# Patient Record
Sex: Male | Born: 1945 | Race: White | Hispanic: No | Marital: Married | Smoking: Never smoker
Health system: Southern US, Community
[De-identification: ages and names within clinical notes are randomized; demographics above are authoritative.]

## PROBLEM LIST (undated history)

## (undated) DIAGNOSIS — M109 Gout, unspecified: Secondary | ICD-10-CM

## (undated) DIAGNOSIS — N401 Enlarged prostate with lower urinary tract symptoms: Secondary | ICD-10-CM

## (undated) DIAGNOSIS — N200 Calculus of kidney: Secondary | ICD-10-CM

## (undated) DIAGNOSIS — E785 Hyperlipidemia, unspecified: Secondary | ICD-10-CM

## (undated) DIAGNOSIS — Z973 Presence of spectacles and contact lenses: Secondary | ICD-10-CM

## (undated) DIAGNOSIS — I10 Essential (primary) hypertension: Secondary | ICD-10-CM

## (undated) DIAGNOSIS — Z87442 Personal history of urinary calculi: Secondary | ICD-10-CM

## (undated) DIAGNOSIS — G4733 Obstructive sleep apnea (adult) (pediatric): Secondary | ICD-10-CM

## (undated) DIAGNOSIS — C61 Malignant neoplasm of prostate: Secondary | ICD-10-CM

## (undated) HISTORY — DX: Obstructive sleep apnea (adult) (pediatric): G47.33

## (undated) HISTORY — DX: Essential (primary) hypertension: I10

## (undated) HISTORY — DX: Gout, unspecified: M10.9

## (undated) HISTORY — DX: Calculus of kidney: N20.0

## (undated) HISTORY — PX: FOOT SURGERY: SHX648

## (undated) HISTORY — DX: Hyperlipidemia, unspecified: E78.5

---

## 1999-08-20 ENCOUNTER — Encounter: Admission: RE | Admit: 1999-08-20 | Discharge: 1999-08-20 | Payer: Self-pay | Admitting: Endocrinology

## 1999-08-20 ENCOUNTER — Encounter: Payer: Self-pay | Admitting: Endocrinology

## 2001-12-20 ENCOUNTER — Ambulatory Visit (HOSPITAL_COMMUNITY): Admission: RE | Admit: 2001-12-20 | Discharge: 2001-12-20 | Payer: Self-pay | Admitting: Gastroenterology

## 2004-05-23 ENCOUNTER — Ambulatory Visit (HOSPITAL_COMMUNITY): Admission: RE | Admit: 2004-05-23 | Discharge: 2004-05-23 | Payer: Self-pay | Admitting: Orthopedic Surgery

## 2007-09-23 HISTORY — PX: SHOULDER ARTHROSCOPY: SHX128

## 2008-02-15 ENCOUNTER — Emergency Department (HOSPITAL_COMMUNITY): Admission: EM | Admit: 2008-02-15 | Discharge: 2008-02-15 | Payer: Self-pay | Admitting: Emergency Medicine

## 2010-10-09 ENCOUNTER — Encounter
Admission: RE | Admit: 2010-10-09 | Discharge: 2010-10-09 | Payer: Self-pay | Source: Home / Self Care | Attending: Internal Medicine | Admitting: Internal Medicine

## 2013-08-05 ENCOUNTER — Encounter: Payer: Self-pay | Admitting: Internal Medicine

## 2013-08-08 ENCOUNTER — Encounter: Payer: Self-pay | Admitting: Internal Medicine

## 2013-08-08 ENCOUNTER — Ambulatory Visit (INDEPENDENT_AMBULATORY_CARE_PROVIDER_SITE_OTHER): Payer: Medicare Other | Admitting: Internal Medicine

## 2013-08-08 VITALS — BP 130/78 | HR 63 | Ht 70.0 in | Wt 227.4 lb

## 2013-08-08 DIAGNOSIS — R05 Cough: Secondary | ICD-10-CM

## 2013-08-08 MED ORDER — GABAPENTIN 300 MG PO CAPS: ORAL_CAPSULE | ORAL | Status: DC

## 2013-08-08 MED ORDER — FLUTICASONE PROPIONATE 50 MCG/ACT NA SUSP: 2.0000 | Freq: Every day | NASAL | Status: DC

## 2013-08-08 NOTE — Patient Instructions (Signed)
Cough is post viral reactive cough and now persistent due to sinus drainage, acid reflux,  and voice stress All of this is working together to cause cyclical cough/LPR cough/irritable larynx syndrome  #Sinus drainage  -  start nasal steroid generic fluticasone inhaler 2 squirts each nostril daily as advised (nurse will do script) - Start 2.7% hypertonic saline nasal spray made by a company called [employer], 2 squirts in each nostril at night  #Possible Acid Reflux  - take otc zegerid 20mg  1 capsule daily on empty stomach (nurse will send script)   -  avoid colas, spices, cheeses, spirits, red meats, beer, chocolates, fried foods etc.,   - sleep with head end of bed elevated  - eat small frequent meals  - do not go to bed for 3 hours after last meal   #Cyclical cough or Voice STress  - please choose 2-3 days and observe complete voice rest - no talking or whispering  - at all times there  there is urge to cough, drink water or swallow or sip on throat lozenge - Please visit with Mr. Carl Schinke neuro rehabilitation speech therapist - Take gabapentin 300mg once daily x 3 days, then 300mg twice daily x 3 days, then 300mg three times daily to continue. If this makes you too sleepy or drowsy call us and we will cut your medication dosing down   #Followup - Myself or NP Tammy I will see you in 4weeks. - consider empiric inhaler therapy or methacholine challenge test for asthma if unimproved or only partially improved at followup  - any problems call or come sooner 

## 2013-08-08 NOTE — Assessment & Plan Note (Signed)
Cough is post viral reactive cough and now persistent due to sinus drainage, acid reflux,  and voice stress All of this is working together to cause cyclical cough/LPR cough/irritable larynx syndrome  #Sinus drainage  -  start nasal steroid generic fluticasone inhaler 2 squirts each nostril daily as advised (nurse will do script) - Start 2.7% hypertonic saline nasal spray made by a company called [employer], 2 squirts in each nostril at night  #Possible Acid Reflux  - take otc zegerid 20mg   1 capsule daily on empty stomach (nurse will send script)   -  avoid colas, spices, cheeses, spirits, red meats, beer, chocolates, fried foods etc.,   - sleep with head end of bed elevated  - eat small frequent meals  - do not go to bed for 3 hours after last meal   #Cyclical cough or Voice STress  - please choose 2-3 days and observe complete voice rest - no talking or whispering  - at all times there  there is urge to cough, drink water or swallow or sip on throat lozenge - Please visit with Mr. Verdie Mosher neuro rehabilitation speech therapist - Take gabapentin 300mg  once daily x 3 days, then 300mg  twice daily x 3 days, then 300mg  three times daily to continue. If this makes you too sleepy or drowsy call us and we will cut your medication dosing down   #Followup - Myself or NP Tammy I will see you in 4weeks. - consider empiric inhaler therapy or methacholine challenge test for asthma if unimproved or only partially improved at followup  - any problems call or come sooner

## 2013-08-08 NOTE — Progress Notes (Signed)
Subjective:    Patient ID: Gregory Reid, male    DOB: Jun 04, 1946, 67 y.o.   MRN: [id number] PCP Pearson Grippe, MD  HPI  IOV 08/08/2013  67 year old male referred by Dr. Pearson Grippe for chronic cough. At baseline he periodically gets respiratory infection that followed by a cough that usually resolves in a few weeks although in the last few years he's noticed that it takes longer for him to resolve his cough. Then in April 2014 he had upper respiratory infection which resolved but the cough never really resolved and is persisted through now for the last 6 months or so. Cough is essentially dry in quality but when he coughs hard he might get out some white mucus. He feels that his cough is emanating from the voice box in the throat area. There is associated ticklish sensation in the throat with a gag and feeling of mucus in the back of the throat. He does clear his throat often. Cough quality is reporting. Coughs day and night. Overall severity is mild and he feels this is social nuisance. 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 Cough is relieved by cold water which he drinks periodically. Waxing and waning course without complete resolution of the cough  COugh relevant hx  Sinus  - Denies active sinus drainage except occasionally in the spring  GI/GERD  - Denies active GERD but eating chocolates does make his cough worse  Pulmonary  - CXR 04/12/13 per report from [facility] docs Pearson Grippe, MD - no active diseaes   Expousre   reports that he has never smoked. He does not have any smokeless tobacco history on file.  BP  - not on ace inhibitor    Dr Gretta Cool Reflux Symptom Index (> 13-15 suggestive of LPR cough) 0 -> 5  =  none ->severe problem 08/08/2013   Hoarseness of problem with voice 1  Clearing  Of Throat 1  Excess throat mucus or feeling of post nasal drip 2  Difficulty swallowing food, liquid or tablets 0  Cough  after eating or lying down 2  Breathing difficulties or choking episodes 0  Troublesome or annoying cough 3  Sensation of something sticking in throat or lump in throat 3  Heartburn, chest pain, indigestion, or stomach acid coming up 1  TOTAL 13     Kouffman Reflux v Neurogenic Cough Differentiator Reflux  Do you awaken from a sound sleep coughing violently?                            With trouble breathing? no  Do you have choking episodes when you cannot  Get enough air, gasping for air ?              no  Do you usually cough when you lie down into  The bed, or when you just lie down to rest ?                          no  Do you usually cough after meals or eating?         no  Do you cough when (or after) you bend over?    no  GERD SCORE  0  Kouffman Reflux v Neurogenic Cough Differentiator Neurogeni  Do you more-or-less cough all day long? yes  Does change of  temperature make you cough? n  Does laughing or chuckling cause you to cough? n  Do fumes (perfume, automobile fumes, burned  Toast, etc.,) cause you to cough ?      n  Does speaking, singing, or talking on the phone cause you to cough   ?               ywa  Neurogenic/Airway score 2     Past Medical History  Diagnosis Date  . Hypertension   . Hyperlipidemia   . Gout   . Nephrolithiasis      Family History  Problem Relation Age of Onset  . Lung cancer Father      History   Social History  . Marital Status: Married    Spouse Name: N/A    Number of Children: N/A  . Years of Education: N/A   Occupational History  . Not on file.   Social History Main Topics  . Smoking status: Never Smoker   . Smokeless tobacco: Not on file  . Alcohol Use: No  . Drug Use: No  . Sexual Activity: Not on file   Other Topics Concern  . Not on file   Social History Narrative  . No narrative on file     No Known Allergies   Outpatient Prescriptions Prior to Visit  Medication Sig Dispense Refill  . allopurinol  (ZYLOPRIM) 300 MG tablet Take 300 mg by mouth daily.      Marland Kitchen amLODipine (NORVASC) 5 MG tablet Take 5 mg by mouth daily.      Marland Kitchen aspirin 81 MG tablet Take 81 mg by mouth daily.      Marland Kitchen atorvastatin (LIPITOR) 20 MG tablet Take 20 mg by mouth daily.      . cholecalciferol (VITAMIN D) 1000 UNITS tablet Take 1,000 Units by mouth daily.      . folic acid (FOLVITE) 800 MCG tablet Take 400 mcg by mouth daily.      . nebivolol (BYSTOLIC) 5 MG tablet Take 5 mg by mouth daily.      . vitamin B-12 (CYANOCOBALAMIN) 1000 MCG tablet Take 2,000 mcg by mouth daily.      Marland Kitchen loratadine (CLARITIN) 10 MG tablet Take 10 mg by mouth daily.       No facility-administered medications prior to visit.   4   Review of Systems  Constitutional: Negative for fever and unexpected weight change.  HENT: Positive for congestion and postnasal drip. Negative for dental problem, ear pain, nosebleeds, rhinorrhea, sinus pressure, sneezing, sore throat and trouble swallowing.   Eyes: Negative for redness and itching.  Respiratory: Positive for cough. Negative for chest tightness, shortness of breath and wheezing.   Cardiovascular: Negative for palpitations and leg swelling.  Gastrointestinal: Negative for nausea and vomiting.  Genitourinary: Negative for dysuria.  Musculoskeletal: Negative for joint swelling.  Skin: Negative for rash.  Neurological: Negative for headaches.  Hematological: Does not bruise/bleed easily.  Psychiatric/Behavioral: Negative for dysphoric mood. The patient is not nervous/anxious.        Objective:   Physical Exam  Nursing note and vitals reviewed. Constitutional: He is oriented to person, place, and time. He appears well-developed and well-nourished. No distress.  HENT:  Head: Normocephalic and atraumatic.  Right Ear: External ear normal.  Left Ear: External ear normal.  Mouth/Throat: Oropharynx is clear and moist. No oropharyngeal exudate.  Postnasal drip present Periodically coughs with a  laryngeal quality to the cough  Eyes: Conjunctivae and EOM are normal. Pupils  are equal, round, and reactive to light. Right eye exhibits no discharge. Left eye exhibits no discharge. No scleral icterus.  Neck: Normal range of motion. Neck supple. No JVD present. No tracheal deviation present. No thyromegaly present.  Cardiovascular: Normal rate, regular rhythm and intact distal pulses.  Exam reveals no gallop and no friction rub.   No murmur heard. Pulmonary/Chest: Effort normal and breath sounds normal. No respiratory distress. He has no wheezes. He has no rales. He exhibits no tenderness.  Abdominal: Soft. Bowel sounds are normal. He exhibits no distension and no mass. There is no tenderness. There is no rebound and no guarding.  Musculoskeletal: Normal range of motion. He exhibits no edema and no tenderness.  Lymphadenopathy:    He has no cervical adenopathy.  Neurological: He is alert and oriented to person, place, and time. He has normal reflexes. No cranial nerve deficit. Coordination normal.  Skin: Skin is warm and dry. No rash noted. He is not diaphoretic. No erythema. No pallor.  Psychiatric: He has a normal mood and affect. His behavior is normal. Judgment and thought content normal.          Assessment & Plan:

## 2013-08-09 ENCOUNTER — Telehealth: Payer: Self-pay | Admitting: Internal Medicine

## 2013-08-09 NOTE — Telephone Encounter (Signed)
Odd,  I have picked this up OTC myself from [facility]  Here are the details    NasaMist Extra Strength Hypertonic (made by [employer]) [employer]ist Extra Strength Hypertonic Saline Spray is a sterile natural saline spray.  [email or web address]  Honestly, if he cannot find it just have the pharmacis get him any hypertonic nasal saline spray and if they still do not have it, he can use netti pot nightly; again available OTC   Dr. Kalman Shan, M.D., [facility].C.P Pulmonary and Critical Care Medicine Staff Physician [facility] Pager: [phone], If no answer or between  15:00h - 7:00h: call [phone]  08/09/2013 3:59 PM

## 2013-08-09 NOTE — Telephone Encounter (Signed)
lmomtcb x1 for pt-- where has he looked? Did he ask the pharmacists? This is by [employer]

## 2013-08-09 NOTE — Telephone Encounter (Signed)
Pt advised to check [facility]. Carron Curie, CMA

## 2013-08-09 NOTE — Telephone Encounter (Signed)
I spoke with pt. He checked with [facility] and they did not have this. They went online and looked to see where this could be found. He was told this has to be compounded and only possibly 2 pharmacies could do this but could not guarantee. Pt is going to get saline spray OTC. Will foward to MR as an [place]

## 2013-08-10 NOTE — Telephone Encounter (Signed)
Spoke to pt he is aware of his appt 08/11/13 Gregory Reid

## 2013-08-12 ENCOUNTER — Ambulatory Visit: Payer: Medicare Other | Attending: Internal Medicine

## 2013-08-12 DIAGNOSIS — IMO0001 Reserved for inherently not codable concepts without codable children: Secondary | ICD-10-CM | POA: Insufficient documentation

## 2013-08-12 DIAGNOSIS — R498 Other voice and resonance disorders: Secondary | ICD-10-CM | POA: Insufficient documentation

## 2013-08-15 ENCOUNTER — Ambulatory Visit: Payer: Medicare Other

## 2013-08-16 ENCOUNTER — Ambulatory Visit: Payer: Medicare Other

## 2013-08-23 ENCOUNTER — Ambulatory Visit: Payer: Medicare Other | Attending: Internal Medicine | Admitting: Speech Pathology

## 2013-08-23 DIAGNOSIS — IMO0001 Reserved for inherently not codable concepts without codable children: Secondary | ICD-10-CM | POA: Insufficient documentation

## 2013-08-23 DIAGNOSIS — R498 Other voice and resonance disorders: Secondary | ICD-10-CM | POA: Insufficient documentation

## 2013-08-26 ENCOUNTER — Ambulatory Visit: Payer: Medicare Other

## 2013-08-30 ENCOUNTER — Ambulatory Visit: Payer: Medicare Other

## 2013-09-02 ENCOUNTER — Ambulatory Visit: Payer: Medicare Other

## 2013-09-05 ENCOUNTER — Encounter: Payer: Self-pay | Admitting: Adult Health

## 2013-09-05 ENCOUNTER — Ambulatory Visit (INDEPENDENT_AMBULATORY_CARE_PROVIDER_SITE_OTHER): Payer: Medicare Other | Admitting: Adult Health

## 2013-09-05 VITALS — BP 116/64 | HR 62 | Temp 97.9°F | Ht 70.0 in | Wt 230.8 lb

## 2013-09-05 DIAGNOSIS — R05 Cough: Secondary | ICD-10-CM

## 2013-09-05 NOTE — Progress Notes (Signed)
Subjective:    Patient ID: Gregory Reid, male    DOB: [birth year], 67 y.o.   MRN: [id number] PCP Pearson Grippe, MD  HPI  IOV 08/08/2013  67 year old male referred by Dr. Pearson Grippe for chronic cough. At baseline he periodically gets respiratory infection that followed by a cough that usually resolves in a few weeks although in the last few years he's noticed that it takes longer for him to resolve his cough. Then in April 2014 he had upper respiratory infection which resolved but the cough never really resolved and is persisted through now for the last 6 months or so. Cough is essentially dry in quality but when he coughs hard he might get out some white mucus. He feels that his cough is emanating from the voice box in the throat area. There is associated ticklish sensation in the throat with a gag and feeling of mucus in the back of the throat. He does clear his throat often. Cough quality is reporting. Coughs day and night. Overall severity is mild and he feels this is social nuisance. 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 Cough is relieved by cold water which he drinks periodically. Waxing and waning course without complete resolution of the cough  COugh relevant hx  Sinus  - Denies active sinus drainage except occasionally in the spring  GI/GERD  - Denies active GERD but eating chocolates does make his cough worse  Pulmonary  - CXR 04/12/13 per report from [facility] docs Pearson Grippe, MD - no active diseaes   Expousre   reports that he has never smoked. He does not have any smokeless tobacco history on file.  BP  - not on ace inhibitor     Dr Gretta Cool Reflux Symptom Index (> 13-15 suggestive of LPR cough) 0 -> 5  =  none ->severe problem 08/08/2013   Hoarseness of problem with voice 1  Clearing  Of Throat 1  Excess throat mucus or feeling of post nasal drip 2  Difficulty swallowing food, liquid or tablets 0  Cough  after eating or lying down 2  Breathing difficulties or choking episodes 0  Troublesome or annoying cough 3  Sensation of something sticking in throat or lump in throat 3  Heartburn, chest pain, indigestion, or stomach acid coming up 1  TOTAL 13     Kouffman Reflux v Neurogenic Cough Differentiator Reflux  Do you awaken from a sound sleep coughing violently?                            With trouble breathing? no  Do you have choking episodes when you cannot  Get enough air, gasping for air ?              no  Do you usually cough when you lie down into  The bed, or when you just lie down to rest ?                          no  Do you usually cough after meals or eating?         no  Do you cough when (or after) you bend over?    no  GERD SCORE  0  Kouffman Reflux v Neurogenic Cough Differentiator Neurogeni  Do you more-or-less cough all day long? yes  Does change  of temperature make you cough? n  Does laughing or chuckling cause you to cough? n  Do fumes (perfume, automobile fumes, burned  Toast, etc.,) cause you to cough ?      n  Does speaking, singing, or talking on the phone cause you to cough   ?               ywa  Neurogenic/Airway score 2    09/05/2013 Follow up cough  4 week follow up - reports cough is improved since last ov, but did develop a cold 2 weeks ago that may have hindered progress.  would like to discuss whether or not follow up is needed w/ Dr Dellia Cloud therapy.  Patient seen last visit for initial consult for her chronic cough. He was started on aggressive reflux, and rhinitis prevention. He was also started on Neurontin. Patient reports his cough is substantially improved. He did go to speech therapy for review. Visit. Prefer not to go back. Patient denies any hemoptysis, discolored mucus, fever, or overt reflux, sinus congestion. Patient says he did have a colposcopy 1-2 weeks ago, but seems to be over the most of the symptoms. He rates his cough has been 75%  improved.    Review of Systems  Constitutional: Negative for fever and unexpected weight change.  HENT:  Negative for dental problem, ear pain, nosebleeds, rhinorrhea, sinus pressure, sneezing, sore throat and trouble swallowing.   Eyes: Negative for redness and itching.  Respiratory: Positive for cough. Negative for chest tightness, shortness of breath and wheezing.   Cardiovascular: Negative for palpitations and leg swelling.  Gastrointestinal: Negative for nausea and vomiting.  Genitourinary: Negative for dysuria.  Musculoskeletal: Negative for joint swelling.  Skin: Negative for rash.  Neurological: Negative for headaches.  Hematological: Does not bruise/bleed easily.  Psychiatric/Behavioral: Negative for dysphoric mood. The patient is not nervous/anxious.        Objective:   Physical Exam  GEN: A/Ox3; pleasant , NAD, well nourished   HEENT:  [place]/AT,  EACs-clear, TMs-wnl, NOSE-clear, THROAT-clear, no lesions, no postnasal drip or exudate noted.   NECK:  Supple w/ fair ROM; no JVD; normal carotid impulses w/o bruits; no thyromegaly or nodules palpated; no lymphadenopathy.  RESP  Clear  P & A; w/o, wheezes/ rales/ or rhonchi.no accessory muscle use, no dullness to percussion  CARD:  RRR, no m/r/g  , no peripheral edema, pulses intact, no cyanosis or clubbing.  GI:   Soft & nt; nml bowel sounds; no organomegaly or masses detected.  Musco: Warm bil, no deformities or joint swelling noted.   Neuro: alert, no focal deficits noted.    Skin: Warm, no lesions or rashes   Assessment & Plan:

## 2013-09-05 NOTE — Patient Instructions (Addendum)
Continue on Saline nasal rinses As needed   Continue on  fluticasone inhaler 2 squirts each nostril daily -  Continue on Zegerid 20mg   1 capsule daily on empty stomach    -  avoid colas, spices, cheeses, spirits, red meats, beer, MINTS, chocolates, fried foods etc.,   - sleep with head end of bed elevated  - eat small frequent meals  - do not go to bed for 3 hours after last meal Continue voice rest and avoid voice strain with singing/prolonged talking /laughing. At all times there  there is urge to cough, drink water or swallow or sip on throat lozenge Decrease Neurontin 300mg  Twice daily  For 2 weeks then 300mg  At bedtime  For 2 weeks then stop.  follow up Dr. Marchelle Gearing in 6 weeks and As needed   May use Delsym 2 tsp Twice daily  As needed  Cough .

## 2013-09-06 ENCOUNTER — Ambulatory Visit: Payer: Medicare Other

## 2013-09-06 NOTE — Assessment & Plan Note (Signed)
Slowly improving  Cont w/ trigger prevention with GERD/AR along with cough suppression  Will try to slowly wean neurontin as he is improving    Plan  Continue on Saline nasal rinses As needed   Continue on  fluticasone inhaler 2 squirts each nostril daily -  Continue on Zegerid 20mg   1 capsule daily on empty stomach    -  avoid colas, spices, cheeses, spirits, red meats, beer, MINTS, chocolates, fried foods etc.,   - sleep with head end of bed elevated  - eat small frequent meals  - do not go to bed for 3 hours after last meal Continue voice rest and avoid voice strain with singing/prolonged talking /laughing. At all times there  there is urge to cough, drink water or swallow or sip on throat lozenge Decrease Neurontin 300mg  Twice daily  For 2 weeks then 300mg  At bedtime  For 2 weeks then stop.  follow up Dr. Marchelle Gearing in 6 weeks and As needed   May use Delsym 2 tsp Twice daily  As needed  Cough .

## 2013-09-13 ENCOUNTER — Ambulatory Visit: Payer: Medicare Other | Admitting: Speech Pathology

## 2013-09-20 ENCOUNTER — Encounter: Payer: Medicare Other | Admitting: Speech Pathology

## 2013-11-03 ENCOUNTER — Encounter (INDEPENDENT_AMBULATORY_CARE_PROVIDER_SITE_OTHER): Payer: Self-pay

## 2013-11-03 ENCOUNTER — Encounter: Payer: Self-pay | Admitting: Internal Medicine

## 2013-11-03 ENCOUNTER — Ambulatory Visit (INDEPENDENT_AMBULATORY_CARE_PROVIDER_SITE_OTHER): Payer: Medicare Other | Admitting: Internal Medicine

## 2013-11-03 VITALS — BP 140/90 | HR 82 | Ht 70.0 in | Wt 225.2 lb

## 2013-11-03 DIAGNOSIS — J329 Chronic sinusitis, unspecified: Secondary | ICD-10-CM

## 2013-11-03 DIAGNOSIS — R05 Cough: Secondary | ICD-10-CM

## 2013-11-03 DIAGNOSIS — R053 Chronic cough: Secondary | ICD-10-CM

## 2013-11-03 DIAGNOSIS — R059 Cough, unspecified: Secondary | ICD-10-CM

## 2013-11-03 NOTE — Progress Notes (Signed)
Subjective:    Patient ID: Gregory Reid, male    DOB: [birth year], 68 y.o.   MRN: [id number]  HPI   IOV 08/08/2013  68 year old male referred by Dr. Jani Gravel for chronic cough. At baseline he periodically gets respiratory infection that followed by a cough that usually resolves in a few weeks although in the last few years he's noticed that it takes longer for him to resolve his cough. Then in April 2014 he had upper respiratory infection which resolved but the cough never really resolved and is persisted through now for the last 6 months or so. Cough is essentially dry in quality but when he coughs hard he might get out some white mucus. He feels that his cough is emanating from the voice box in the throat area. There is associated ticklish sensation in the throat with a gag and feeling of mucus in the back of the throat. He does clear his throat often. Cough quality is reporting. Coughs day and night. Overall severity is mild and he feels this is social nuisance. 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 Cough is relieved by cold water which he drinks periodically. Waxing and waning course without complete resolution of the cough  COugh relevant hx  Sinus  - Denies active sinus drainage except occasionally in the spring  GI/GERD  - Denies active GERD but eating chocolates does make his cough worse  Pulmonary  - CXR 04/12/13 per report from [place], [place] - no active diseaes   Expousre   reports that he has never smoked. He does not have any smokeless tobacco history on file.  BP  - not on ace inhibitor   09/05/2013 Follow up cough  4 week follow up - reports cough is improved since last ov, but did develop a cold 2 weeks ago that may have hindered progress.  would like to discuss whether or not follow up is needed w/ Dr Hale Drone therapy.  Patient seen last visit for initial consult for her chronic  cough. He was started on aggressive reflux, and rhinitis prevention. He was also started on Neurontin. Patient reports his cough is substantially improved. He did go to speech therapy for review. Visit. Prefer not to go back. Patient denies any hemoptysis, discolored mucus, fever, or overt reflux, sinus congestion. Patient says he did have a colposcopy 1-2 weeks ago, but seems to be over the most of the symptoms. He rates his cough has been 75% improved.   REC Sius: saline and nasal stgeroid GERD: zergerid and diet LPR: voice rfest, decreasd and stop neurontin, use delsum   OV 11/03/2013   Followup chronic cough  He was doing really well up until end of December 2014. At that time he was on Neurontin but by early January 2015 he came off Neurontin as advised. Then by mid to end January 2015 cough is recurred and return to baseline severity. He did do some travel to [facility] and he was exposed to some pollen he feels that this might trigger the cough. Cough is associated with postnasal drainage that is significant. Nasal steroid and nasal saline spray to help with the drainage of the cough but due to some mild epistaxis he quit taking the nasal steroids. He is compliant with acid reflux treatment but does not feel this is a problem. He does not report any chest tightness or wheezing. He  is frustrated by the presence of cough and wants to get rid of it. He does admit to chronic postnasal drainage but no allergies and ticklish sensation of the throat. RSI cough score is 18 and is consistent with irritable larynx and is worse than before    Dr Lorenza Cambridge Reflux Symptom Index (> 13-15 suggestive of LPR cough)  08/08/2013  11/03/2013   Hoarseness of problem with voice 1 2  Clearing  Of Throat 1 2  Excess throat mucus or feeling of post nasal drip 2 3  Difficulty swallowing food, liquid or tablets 0 0  Cough after eating or lying down 2 3  Breathing difficulties or choking episodes  0 2  Troublesome or annoying cough 3 4  Sensation of something sticking in throat or lump in throat 3 2  Heartburn, chest pain, indigestion, or stomach acid coming up 1 0  TOTAL 13 18     Kouffman Reflux v Neurogenic Cough Differentiator Reflux  Do you awaken from a sound sleep coughing violently?                            With trouble breathing? no  Do you have choking episodes when you cannot  Get enough air, gasping for air ?              no  Do you usually cough when you lie down into  The bed, or when you just lie down to rest ?                          no  Do you usually cough after meals or eating?         no  Do you cough when (or after) you bend over?    no  GERD SCORE  0  Kouffman Reflux v Neurogenic Cough Differentiator Neurogeni  Do you more-or-less cough all day long? yes  Does change of temperature make you cough? n  Does laughing or chuckling cause you to cough? n  Do fumes (perfume, automobile fumes, burned  Toast, etc.,) cause you to cough ?      n  Does speaking, singing, or talking on the phone cause you to cough   ?               ywa  Neurogenic/Airway score 2     Review of Systems  Constitutional: Negative for fever and unexpected weight change.  HENT: Positive for postnasal drip. Negative for congestion, dental problem, ear pain, nosebleeds, rhinorrhea, sinus pressure, sneezing, sore throat and trouble swallowing.   Eyes: Negative for redness and itching.  Respiratory: Positive for cough. Negative for chest tightness, shortness of breath and wheezing.   Cardiovascular: Negative for palpitations and leg swelling.  Gastrointestinal: Negative for nausea and vomiting.  Genitourinary: Negative for dysuria.  Musculoskeletal: Negative for joint swelling.  Skin: Negative for rash.  Neurological: Negative for headaches.  Hematological: Does not bruise/bleed easily.  Psychiatric/Behavioral: Negative for dysphoric mood. The patient is not nervous/anxious.         Objective:   Physical Exam  Nursing note and vitals reviewed. Constitutional: He is oriented to person, place, and time. He appears well-developed and well-nourished. No distress.  HENT:  Head: Normocephalic and atraumatic.  Right Ear: External ear normal.  Left Ear: External ear normal.  Mouth/Throat: Oropharynx is clear and moist. No oropharyngeal exudate.  Post nasal drainage + Coughs  periodically +   Eyes: Conjunctivae and EOM are normal. Pupils are equal, round, and reactive to light. Right eye exhibits no discharge. Left eye exhibits no discharge. No scleral icterus.  Neck: Normal range of motion. Neck supple. No JVD present. No tracheal deviation present. No thyromegaly present.  Cardiovascular: Normal rate, regular rhythm and intact distal pulses.  Exam reveals no gallop and no friction rub.   No murmur heard. Pulmonary/Chest: Effort normal and breath sounds normal. No respiratory distress. He has no wheezes. He has no rales. He exhibits no tenderness.  Abdominal: Soft. Bowel sounds are normal. He exhibits no distension and no mass. There is no tenderness. There is no rebound and no guarding.  Musculoskeletal: Normal range of motion. He exhibits no edema and no tenderness.  Lymphadenopathy:    He has no cervical adenopathy.  Neurological: He is alert and oriented to person, place, and time. He has normal reflexes. No cranial nerve deficit. Coordination normal.  Skin: Skin is warm and dry. No rash noted. He is not diaphoretic. No erythema. No pallor.  Psychiatric: He has a normal mood and affect. His behavior is normal. Judgment and thought content normal.          Assessment & Plan:

## 2013-11-03 NOTE — Patient Instructions (Signed)
Please do sinus CT wo contrast and referENT Restart nasal steroid Continue saline nasal spray  Continue all acid reflux measurs  Do methacholine challenge test for asthma Do  HRCT chest for chronic couhg  High Resolution CT chest without contrast on ILD protocol. Only  Dr Lorin Picket or Dr. Vinnie Langton to read  Followup Return in 4-6 weeks but after completing all of abve Might have to cnsider neurontin at followup

## 2013-11-05 NOTE — Assessment & Plan Note (Addendum)
Chronic cough has recurred. ? Due to loss of neurontin effect + sinus issues +/- asthma. Need to rule out ILD as well  PLAN Please do sinus CT wo contrast and referENT Restart nasal steroid Continue saline nasal spray  Continue all acid reflux measurs  Do methacholine challenge test for asthma Do  HRCT chest for chronic couhg  High Resolution CT chest without contrast on ILD protocol. Only  Dr Lorin Picket or Dr. Vinnie Langton to read  Followup Return in 4-6 weeks but after completing all of abve Might have to cnsider neurontin at followup

## 2013-11-08 ENCOUNTER — Other Ambulatory Visit: Payer: Medicare Other

## 2013-11-08 ENCOUNTER — Encounter (HOSPITAL_COMMUNITY): Payer: Medicare Other

## 2013-11-08 ENCOUNTER — Inpatient Hospital Stay: Admission: RE | Admit: 2013-11-08 | Payer: Medicare Other | Source: Ambulatory Visit

## 2013-11-09 ENCOUNTER — Ambulatory Visit (HOSPITAL_COMMUNITY)
Admission: RE | Admit: 2013-11-09 | Discharge: 2013-11-09 | Disposition: A | Discharge status: Home / Self Care | Payer: Medicare Other | Source: Ambulatory Visit | Attending: Internal Medicine | Admitting: Internal Medicine

## 2013-11-09 ENCOUNTER — Ambulatory Visit (INDEPENDENT_AMBULATORY_CARE_PROVIDER_SITE_OTHER)
Admission: RE | Admit: 2013-11-09 | Discharge: 2013-11-09 | Disposition: A | Discharge status: Home / Self Care | Payer: Medicare Other | Source: Ambulatory Visit | Attending: Internal Medicine | Admitting: Internal Medicine

## 2013-11-09 DIAGNOSIS — R059 Cough, unspecified: Secondary | ICD-10-CM

## 2013-11-09 DIAGNOSIS — J329 Chronic sinusitis, unspecified: Secondary | ICD-10-CM

## 2013-11-09 DIAGNOSIS — R05 Cough: Secondary | ICD-10-CM

## 2013-11-09 DIAGNOSIS — R053 Chronic cough: Secondary | ICD-10-CM

## 2013-11-09 LAB — PULMONARY FUNCTION TEST
FEF 25-75 Post: 3.84 L/sec
FEF 25-75 Pre: 2.81 L/sec
FEF2575-%Change-Post: 36 %
FEF2575-%PRED-POST: 153 %
FEF2575-%PRED-PRE: 112 %
FEV1-%Change-Post: 6 %
FEV1-%PRED-PRE: 100 %
FEV1-%Pred-Post: 107 %
FEV1-PRE: 3.23 L
FEV1-Post: 3.45 L
FEV1FVC-%Change-Post: 7 %
FEV1FVC-%PRED-PRE: 106 %
FEV6-%Change-Post: 0 %
FEV6-%PRED-POST: 98 %
FEV6-%Pred-Pre: 98 %
FEV6-Post: 4.06 L
FEV6-Pre: 4.05 L
FEV6FVC-%Change-Post: 0 %
FEV6FVC-%Pred-Post: 105 %
FEV6FVC-%Pred-Pre: 104 %
FVC-%CHANGE-POST: 0 %
FVC-%PRED-POST: 93 %
FVC-%Pred-Pre: 94 %
FVC-POST: 4.08 L
FVC-Pre: 4.1 L
POST FEV1/FVC RATIO: 85 %
POST FEV6/FVC RATIO: 99 %
PRE FEV1/FVC RATIO: 79 %
Pre FEV6/FVC Ratio: 99 %

## 2013-11-09 MED ORDER — METHACHOLINE 16 MG/ML NEB SOLN
2.0000 mL | Freq: Once | RESPIRATORY_TRACT | Status: AC
  Administered 2013-11-09: 32 mg via RESPIRATORY_TRACT

## 2013-11-09 MED ORDER — SODIUM CHLORIDE 0.9 % IN NEBU
3.0000 mL | INHALATION_SOLUTION | Freq: Once | RESPIRATORY_TRACT | Status: AC
  Administered 2013-11-09: 3 mL via RESPIRATORY_TRACT

## 2013-11-09 MED ORDER — METHACHOLINE 0.25 MG/ML NEB SOLN
2.0000 mL | Freq: Once | RESPIRATORY_TRACT | Status: AC
  Administered 2013-11-09: 0.5 mg via RESPIRATORY_TRACT

## 2013-11-09 MED ORDER — METHACHOLINE 4 MG/ML NEB SOLN
2.0000 mL | Freq: Once | RESPIRATORY_TRACT | Status: AC
  Administered 2013-11-09: 8 mg via RESPIRATORY_TRACT

## 2013-11-09 MED ORDER — METHACHOLINE 1 MG/ML NEB SOLN
2.0000 mL | Freq: Once | RESPIRATORY_TRACT | Status: AC
  Administered 2013-11-09: 2 mg via RESPIRATORY_TRACT

## 2013-11-09 MED ORDER — ALBUTEROL SULFATE (2.5 MG/3ML) 0.083% IN NEBU
2.5000 mg | INHALATION_SOLUTION | Freq: Once | RESPIRATORY_TRACT | Status: AC
  Administered 2013-11-09: 2.5 mg via RESPIRATORY_TRACT

## 2013-11-09 MED ORDER — METHACHOLINE 0.0625 MG/ML NEB SOLN
2.0000 mL | Freq: Once | RESPIRATORY_TRACT | Status: AC
  Administered 2013-11-09: 0.125 mg via RESPIRATORY_TRACT

## 2013-11-16 ENCOUNTER — Other Ambulatory Visit: Payer: Self-pay | Admitting: Internal Medicine

## 2013-11-16 DIAGNOSIS — I2510 Atherosclerotic heart disease of native coronary artery without angina pectoris: Secondary | ICD-10-CM

## 2013-11-18 ENCOUNTER — Institutional Professional Consult (permissible substitution): Payer: Medicare Other | Admitting: Cardiology

## 2013-11-18 ENCOUNTER — Telehealth: Payer: Self-pay | Admitting: Internal Medicine

## 2013-11-18 NOTE — Telephone Encounter (Signed)
Just see DR Marlou Porch - cards definitely  Hold off allergy referral till he sees me at [facility] And then we can discuss  PPI - if nexium is expensive, can try OTC zegerid 20mg  daily on an empty stomach. OR we can try prescritpion protonix  Dr. Brand Males, M.D., [facility].C.P Pulmonary and Critical Care Medicine Staff Physician [place] [facility] Pager: [phone], If no answer or between  15:00h - 7:00h: call [phone]  11/18/2013 12:35 PM

## 2013-11-18 NOTE — Telephone Encounter (Signed)
Notes Recorded by Brand Males, MD on 11/11/2013 at 6:07 PM No ILD. Let him know there is coronary artery calcification: and if no stress test in a past few years, refer cards  Spoke with the pt and notified of these recs  He verbalized understanding  He is scheduled to see Dr. Marlou Porch in March  He states that Dr Janace Hoard rec PPI therapy and allergy referral  He feels uneasy about this- seeing another specialist and the nexium was going to cost over 200 $  He wants to know what MR thinks  Please advise thanks!

## 2013-11-18 NOTE — Telephone Encounter (Signed)
Spoke with the pt and notified of recs per MR  He verbalized understanding and nothing further needed 

## 2013-12-06 ENCOUNTER — Encounter: Payer: Self-pay | Admitting: Cardiology

## 2013-12-06 DIAGNOSIS — N200 Calculus of kidney: Secondary | ICD-10-CM | POA: Insufficient documentation

## 2013-12-06 DIAGNOSIS — E785 Hyperlipidemia, unspecified: Secondary | ICD-10-CM | POA: Insufficient documentation

## 2013-12-06 DIAGNOSIS — I10 Essential (primary) hypertension: Secondary | ICD-10-CM | POA: Insufficient documentation

## 2013-12-06 DIAGNOSIS — M109 Gout, unspecified: Secondary | ICD-10-CM | POA: Insufficient documentation

## 2013-12-08 ENCOUNTER — Encounter: Payer: Self-pay | Admitting: Internal Medicine

## 2013-12-08 ENCOUNTER — Ambulatory Visit (INDEPENDENT_AMBULATORY_CARE_PROVIDER_SITE_OTHER): Payer: Medicare Other | Admitting: Internal Medicine

## 2013-12-08 ENCOUNTER — Encounter (INDEPENDENT_AMBULATORY_CARE_PROVIDER_SITE_OTHER): Payer: Self-pay

## 2013-12-08 VITALS — BP 128/78 | HR 80 | Ht 70.0 in | Wt 223.0 lb

## 2013-12-08 DIAGNOSIS — I2510 Atherosclerotic heart disease of native coronary artery without angina pectoris: Secondary | ICD-10-CM

## 2013-12-08 DIAGNOSIS — K7689 Other specified diseases of liver: Secondary | ICD-10-CM

## 2013-12-08 DIAGNOSIS — K760 Fatty (change of) liver, not elsewhere classified: Secondary | ICD-10-CM

## 2013-12-08 DIAGNOSIS — R059 Cough, unspecified: Secondary | ICD-10-CM

## 2013-12-08 DIAGNOSIS — R05 Cough: Secondary | ICD-10-CM

## 2013-12-08 DIAGNOSIS — R053 Chronic cough: Secondary | ICD-10-CM

## 2013-12-08 MED ORDER — RANITIDINE HCL 150 MG PO TABS: 150.0000 mg | ORAL_TABLET | Freq: Two times a day (BID) | ORAL | Status: DC

## 2013-12-08 NOTE — Progress Notes (Signed)
Subjective:    Patient ID: Gregory Reid, male    DOB: 25-Mar-1946, 68 y.o.   MRN: [id number]  HPI   IOV 08/08/2013  68 year old male referred by Dr. Jani Gravel for chronic cough. At baseline he periodically gets respiratory infection that followed by a cough that usually resolves in a few weeks although in the last few years he's noticed that it takes longer for him to resolve his cough. Then in April 2014 he had upper respiratory infection which resolved but the cough never really resolved and is persisted through now for the last 6 months or so. Cough is essentially dry in quality but when he coughs hard he might get out some white mucus. He feels that his cough is emanating from the voice box in the throat area. There is associated ticklish sensation in the throat with a gag and feeling of mucus in the back of the throat. He does clear his throat often. Cough quality is reporting. Coughs day and night. Overall severity is mild and he feels this is social nuisance. 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 Cough is relieved by cold water which he drinks periodically. Waxing and waning course without complete resolution of the cough  COugh relevant hx  Sinus  - Denies active sinus drainage except occasionally in the spring  GI/GERD  - Denies active GERD but eating chocolates does make his cough worse  Pulmonary  - CXR 04/12/13 per report from [place], MD - no active diseaes   Expousre   reports that he has never smoked. He does not have any smokeless tobacco history on file.  BP  - not on ace inhibitor   09/05/2013 Follow up cough  4 week follow up - reports cough is improved since last ov, but did develop a cold 2 weeks ago that may have hindered progress.  would like to discuss whether or not follow up is needed w/ Dr Hale Drone therapy.  Patient seen last visit for initial consult for her chronic  cough. He was started on aggressive reflux, and rhinitis prevention. He was also started on Neurontin. Patient reports his cough is substantially improved. He did go to speech therapy for review. Visit. Prefer not to go back. Patient denies any hemoptysis, discolored mucus, fever, or overt reflux, sinus congestion. Patient says he did have a colposcopy 1-2 weeks ago, but seems to be over the most of the symptoms. He rates his cough has been 75% improved.   REC Sius: saline and nasal stgeroid GERD: zergerid and diet LPR: voice rfest, decreasd and stop neurontin, use delsum   OV 11/03/2013   Followup chronic cough  He was doing really well up until end of December 2014. At that time he was on Neurontin but by early January 2015 he came off Neurontin as advised. Then by mid to end January 2015 cough is recurred and return to baseline severity. He did do some travel to [place] and he was exposed to some pollen he feels that this might trigger the cough. Cough is associated with postnasal drainage that is significant. Nasal steroid and nasal saline spray to help with the drainage of the cough but due to some mild epistaxis he quit taking the nasal steroids. He is compliant with acid reflux treatment but does not feel this is a problem. He does not report any chest tightness or wheezing.  He is frustrated by the presence of cough and wants to get rid of it. He does admit to chronic postnasal drainage but no allergies and ticklish sensation of the throat. RSI cough score is 18 and is consistent with irritable larynx and is worse than before  REC Please do sinus CT wo contrast and referENT Restart nasal steroid Continue saline nasal spray  Continue all acid reflux measurs  Do methacholine challenge test for asthma Do  HRCT chest for chronic couhg  High Resolution CT chest without contrast on ILD protocol. Only  Dr Lorin Picket or Dr. Vinnie Langton to read  Followup Return  in 4-6 weeks but after completing all of abve Might have to cnsider neurontin at followup   OV 12/08/2013 Chief Complaint  Patient presents with  . Follow-up    discuss test results. Pt states cough is some improved.     Chronic cough to discuss results. The results are detailed below. CT scan of the chest is normal. CT scan of the sinuses normal. He had ENT evaluation and his vocal cords were. Acid reflux was considered the cause. His Nexium dose was doubled but he did not tolerate that and it was too expensive. So he is on back to taking it once a day but he wants an alternate additional drug like ranitidine which is cheap. He was also referred to allergy by ENT but he wants to hold this off. 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  He continues to have some cough. It is improved but recently had a confirmed as a flu and since in the cough is worse. RSI cough score today is  Of note, the additional findings on his imaging which include coronary artery calcification for which we have set him up with a cardiology appointment and also fatty liver related to his visceral obesity   CT cjest 11/09/13 IMPRESSION:  1. No findings to suggest an underlying interstitial lung disease at  this time.  2. Evidence of mild air trapping, indicative of mild small airways  disease.  3. Atherosclerosis, including left main and 3 vessel coronary artery  disease. Please note that although the presence of coronary artery  calcium documents the presence of coronary artery disease, the  severity of this disease and any potential stenosis cannot be  assessed on this non-gated CT examination. Assessment for potential  risk factor modification, dietary therapy or pharmacologic therapy  may be warranted, if clinically indicated.  4. Hepatic steatosis.  Electronically Signed  By: Vinnie Langton M.D.  On: 11/09/2013 16:02  IMPRESSION: CT sinsus 11/09/13 No evidence of inflammatory sinus disease. Negative except for a  retention cyst at the floor of the left maxillary sinus, probably  not significant.  Electronically Signed  By: Nelson Chimes M.D.  On: 11/09/2013 14:48   ENT Eval 12/01/13 - advised aggressive reflux measures and nexium dose stepped up. THey offered allergy eval but he refused   PFT methacholine  - normal but has VCD on flow volume loop     Dr Lorenza Cambridge Reflux Symptom Index (> 13-15 suggestive of LPR cough)  08/08/2013  11/03/2013   Hoarseness of problem with voice 1 2  Clearing  Of Throat 1 2  Excess throat mucus or feeling of post nasal drip 2 3  Difficulty swallowing food, liquid or tablets 0 0  Cough after eating or lying down 2 3  Breathing difficulties or choking  episodes 0 2  Troublesome or annoying cough 3 4  Sensation of something sticking in throat or lump in throat 3 2  Heartburn, chest pain, indigestion, or stomach acid coming up 1 0  TOTAL 13 18      Review of Systems  Constitutional: Negative for fever and unexpected weight change.  HENT: Negative for congestion, dental problem, ear pain, nosebleeds, postnasal drip, rhinorrhea, sinus pressure, sneezing, sore throat and trouble swallowing.   Eyes: Negative for redness and itching.  Respiratory: Negative for cough, chest tightness, shortness of breath and wheezing.   Cardiovascular: Negative for palpitations and leg swelling.  Gastrointestinal: Negative for nausea and vomiting.  Genitourinary: Negative for dysuria.  Musculoskeletal: Negative for joint swelling.  Skin: Negative for rash.  Neurological: Negative for headaches.  Hematological: Does not bruise/bleed easily.  Psychiatric/Behavioral: Negative for dysphoric mood. The patient is not nervous/anxious.        Objective:   Physical Exam  Filed Vitals:   12/08/13 1341  BP: 128/78  Pulse: 80  Height: 5\' 10"   (1.778 m)  Weight: 223 lb (101.152 kg)  SpO2: 99%    Discussion only visit      Assessment & Plan:

## 2013-12-08 NOTE — Patient Instructions (Addendum)
Cough is post viral reactive cough and now persistent due to sinus drainage, acid reflux,  and voice stress All of this is working together to cause cyclical cough/LPR cough/irritable larynx syndrome You have to do the below with 100% compliance to see improvement   #Sinus drainage  -  continuet nasal steroid generic fluticasone inhaler 2 squirts each nostril daily as advised - start netti pot saline rinse at night  #Possible Acid Reflux  -continue nexium 1 cap daily (I understand if 4 caps a day was too muich for you) - START ranitidine 300mg  qhs    -  avoid colas, spices, cheeses, spirits, red meats, beer, chocolates, fried foods etc.,   - sleep with head end of bed elevated  - eat small frequent meals  - do not go to bed for 3 hours after last meal - EAT LOW GLYCEMIC DIET (left lane only)   #Cyclical cough or Voice STress  - - at all times there  there is urge to cough, drink water or swallow or sip on throat lozenge - I understand that you do not want to do neurontin or speech therapy anymore  #Coronary ARtery Calcificiation  - keep up appt with Dr Marlou Porch  #Weight mgmt  - follow left lane foods only and lose weight - very important you lose weight and waist as party of lifestyle modification given fatty liver and coronary artery calcification - low glycemic diet will help with acid reflux as well that can in turn help cough   #FOllowup   3 months or sooner if needed Cough score at followup

## 2013-12-09 DIAGNOSIS — K760 Fatty (change of) liver, not elsewhere classified: Secondary | ICD-10-CM | POA: Insufficient documentation

## 2013-12-09 DIAGNOSIS — I2510 Atherosclerotic heart disease of native coronary artery without angina pectoris: Secondary | ICD-10-CM | POA: Insufficient documentation

## 2013-12-09 NOTE — Assessment & Plan Note (Signed)
Cough is post viral reactive cough and now persistent due to sinus drainage, acid reflux,  and voice stress All of this is working together to cause cyclical cough/LPR cough/irritable larynx syndrome You have to do the below with 100% compliance to see improvement   #Sinus drainage  -  continuet nasal steroid generic fluticasone inhaler 2 squirts each nostril daily as advised - start netti pot saline rinse at night  #Possible Acid Reflux  -continue nexium 1 cap daily (I understand if 4 caps a day was too muich for you) - START ranitidine 300mg  qhs    -  avoid colas, spices, cheeses, spirits, red meats, beer, chocolates, fried foods etc.,   - sleep with head end of bed elevated  - eat small frequent meals  - do not go to bed for 3 hours after last meal - EAT LOW GLYCEMIC DIET (left lane only)   #Cyclical cough or Voice STress  - - at all times there  there is urge to cough, drink water or swallow or sip on throat lozenge - I understand that you do not want to do neurontin or speech therapy anymore   > 50% of this > 25 min visit spent in face to face counseling (15 min visit converted to 25 min)

## 2013-12-09 NOTE — Assessment & Plan Note (Signed)
#  Coronary ARtery Calcificiation  - keep up appt with Dr Marlou Porch

## 2013-12-09 NOTE — Assessment & Plan Note (Addendum)
#  Weight mgmt  - follow left lane foods only and lose weight - very important you lose weight and waist as party of lifestyle modification given fatty liver and coronary artery calcification - low glycemic diet will help with acid reflux as well that can in turn help cough   #FOllowup   3 months or sooner if needed Cough score at followup At some point you will need a gi referral; talk to Raymond Gurney, MD  abiout thiss problem  > 50% of this > 25 min visit spent in face to face counseling (15 min visit converted to 25 min)

## 2013-12-16 ENCOUNTER — Ambulatory Visit (INDEPENDENT_AMBULATORY_CARE_PROVIDER_SITE_OTHER): Payer: Medicare Other | Admitting: Cardiology

## 2013-12-16 ENCOUNTER — Encounter: Payer: Self-pay | Admitting: Cardiology

## 2013-12-16 VITALS — BP 130/70 | Ht 70.0 in | Wt 216.0 lb

## 2013-12-16 DIAGNOSIS — E785 Hyperlipidemia, unspecified: Secondary | ICD-10-CM

## 2013-12-16 DIAGNOSIS — R059 Cough, unspecified: Secondary | ICD-10-CM

## 2013-12-16 DIAGNOSIS — R05 Cough: Secondary | ICD-10-CM

## 2013-12-16 DIAGNOSIS — I10 Essential (primary) hypertension: Secondary | ICD-10-CM

## 2013-12-16 DIAGNOSIS — I2584 Coronary atherosclerosis due to calcified coronary lesion: Secondary | ICD-10-CM

## 2013-12-16 DIAGNOSIS — I2510 Atherosclerotic heart disease of native coronary artery without angina pectoris: Secondary | ICD-10-CM

## 2013-12-16 NOTE — Progress Notes (Signed)
[facility] [address], [address] [address], [place]  [address] Phone: [phone] Fax:  [phone]  Date:  12/16/2013   ID:  Gregory Reid, DOB Jun 10, 1946, MRN [id number]  PCP:  Jani Gravel, MD   History of Present Illness: Gregory Reid is a 68 y.o. male here for the evaluation of coronary artery calcification. This was seen on CT scan on 11/09/13. Calcification included left main and 3 vessel coronary artery disease. Has cardiac risk factors of hypertension, hyperlipidemia, age. CT scan was performed previously because of chronic cough. He had upper respiratory infection in April of 2014 which resolved but his cough persisted for proximally 6 months. 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 He has been referred to ENT.  Overall, asymptomatic with no anginal symptoms, no shortness of breath, no palpitations, no syncope. He will occasionally feel somewhat fatigued but he is attributing this to his age.   Wt Readings from Last 3 Encounters:  12/16/13 216 lb (97.977 kg)  12/08/13 223 lb (101.152 kg)  11/03/13 225 lb 3.2 oz (102.15 kg)     Past Medical History  Diagnosis Date  . Hypertension   . Hyperlipidemia   . Gout   . Nephrolithiasis     Past Surgical History  Procedure Laterality Date  . Shoulder surgery      Current Outpatient Prescriptions  Medication Sig Dispense Refill  . allopurinol (ZYLOPRIM) 300 MG tablet Take 300 mg by mouth daily.      Marland Kitchen amLODipine (NORVASC) 5 MG tablet Take 5 mg by mouth daily.      Marland Kitchen aspirin 81 MG tablet Take 81 mg by mouth daily.      Marland Kitchen atorvastatin (LIPITOR) 20 MG tablet Take 20 mg by mouth daily.      . cholecalciferol (VITAMIN D) 1000 UNITS tablet Take 1,000 Units by mouth daily.      . fluticasone (FLONASE) 50 MCG/ACT nasal spray Place 2 sprays into both nostrils as needed.      . folic acid (FOLVITE) 275 MCG tablet Take 400 mcg by mouth daily.      . nebivolol (BYSTOLIC) 5 MG tablet  Take 5 mg by mouth daily.      . nitroGLYCERIN (NITRODUR - DOSED IN MG/24 HR) 0.2 mg/hr patch Place 0.5 patches onto the skin every 12 (twelve) hours. 12 hours on and 12 hours off      . ranitidine (ZANTAC) 150 MG tablet Take 1 tablet (150 mg total) by mouth 2 (two) times daily.  180 tablet  0  . vitamin B-12 (CYANOCOBALAMIN) 1000 MCG tablet Take 2,000 mcg by mouth daily.       No current facility-administered medications for this visit.    Allergies:   No Known Allergies  Social History:  The patient  reports that he has never smoked. He does not have any smokeless tobacco history on file. He reports that he does not drink alcohol or use illicit drugs.   Family History  Problem Relation Age of Onset  . Lung cancer Father     ROS:  Please see the history of present illness.   Denies any fevers, chills, chest pain, syncope, orthopnea, PND, rashes. Positive cough as described above.   All other systems reviewed and negative.   PHYSICAL EXAM: VS:  BP 130/70  Ht 5\' 10"  (1.778 m)  Wt 216 lb (97.977 kg)  BMI 30.99 kg/m2 Well nourished, well developed, in no  acute distress HEENT: normal, [place]/AT, EOMI Neck: no JVD, normal carotid upstroke, no bruit Cardiac:  normal S1, S2; RRR; no murmur Lungs:  clear to auscultation bilaterally, no wheezing, rhonchi or rales Abd: soft, nontender, no hepatomegaly, no bruitsoverweight Ext: no edema, 2+ distal pulses Skin: warm and dry GU: deferred Neuro: no focal abnormalities noted, AAO x 3  EKG:  Sinus rhythm rate 67 with no other abnormalities    CT scan 11/09/13 - three-vessel coronary artery calcification-Personally viewed and shown to patient.  ASSESSMENT AND PLAN:  1. Coronary artery calcification-significant 3 vessel coronary artery calcification. No symptoms of chest pain, no symptoms of shortness of breath. I would like to check a nuclear stress test to ensure that he does not have any evidence of silent ischemia or flow limitation. Regardless,  continue with aggressive secondary risk prevention with atorvastatin, aspirin, beta blocker as he is taking.  2. Hyperlipidemia-Dr. Maudie Mercury is managing his lipids and I would advocate for LDL less than 70. 3. Obesity-Continue encourage weight loss. 4. Cough-post viral-seeing Dr. Lynford Citizen. Improved. 5. Achilles tendinitis-this is the reason he is taking his nitroglycerin patch. 6. We will set up for one year followup  Signed, Candee Furbish, MD [facility]  12/16/2013 10:46 AM

## 2013-12-16 NOTE — Patient Instructions (Signed)
Your physician recommends that you continue on your current medications as directed. Please refer to the Current Medication list given to you today.  Your physician has requested that you have en exercise stress myoview. For further information please visit [email or web address]. Please follow instruction sheet, as given.  Your physician wants you to follow-up in: 1 year with Dr. Marlou Porch. You will receive a reminder letter in the mail two months in advance. If you don't receive a letter, please call our office to schedule the follow-up appointment.

## 2013-12-27 ENCOUNTER — Ambulatory Visit (HOSPITAL_COMMUNITY): Payer: Medicare Other | Attending: Cardiology | Admitting: Radiology

## 2013-12-27 VITALS — BP 145/75 | HR 61 | Ht 70.0 in | Wt 214.0 lb

## 2013-12-27 DIAGNOSIS — E785 Hyperlipidemia, unspecified: Secondary | ICD-10-CM | POA: Insufficient documentation

## 2013-12-27 DIAGNOSIS — I10 Essential (primary) hypertension: Secondary | ICD-10-CM | POA: Insufficient documentation

## 2013-12-27 DIAGNOSIS — I2510 Atherosclerotic heart disease of native coronary artery without angina pectoris: Secondary | ICD-10-CM

## 2013-12-27 DIAGNOSIS — I2584 Coronary atherosclerosis due to calcified coronary lesion: Secondary | ICD-10-CM

## 2013-12-27 MED ORDER — TECHNETIUM TC 99M SESTAMIBI GENERIC - CARDIOLITE
30.0000 | Freq: Once | INTRAVENOUS | Status: AC | PRN
  Administered 2013-12-27: 30 via INTRAVENOUS

## 2013-12-27 MED ORDER — TECHNETIUM TC 99M SESTAMIBI GENERIC - CARDIOLITE
10.0000 | Freq: Once | INTRAVENOUS | Status: AC | PRN
  Administered 2013-12-27: 10 via INTRAVENOUS

## 2013-12-27 NOTE — Progress Notes (Signed)
[facility] [address] [place], [place] [address] [phone]    Cardiology Nuclear Med Study  Gregory Reid is a 68 y.o. male     MRN : [id number]     DOB: 1945/11/06  Procedure Date: 12/27/2013  Nuclear Med Background Indication for Stress Test:  Evaluation for Ischemia and CT scan on 11/09/13 with calcification  and 3 vessel CAD History:  No known CAD, Positive Chest CT Cardiac Risk Factors: Hypertension and Lipids  Symptoms:  None indicated   Nuclear Pre-Procedure Caffeine/Decaff Intake:  None NPO After: 8:00pm   Lungs:  clear O2 Sat: 97% on room air. IV 0.9% NS with Angio Cath:  22g  IV Site: R Antecubital  IV Started by:  Matilde Haymaker, RN  Chest Size (in):  42 Cup Size: n/a  Height: 5\' 10"  (1.778 m)  Weight:  214 lb (97.07 kg)  BMI:  Body mass index is 30.71 kg/(m^2). Tech Comments:  No Bystolic x 36 hrs    Nuclear Med Study 1 or 2 day study: 1 day  Stress Test Type:  Stress  Reading MD: n/a  Order Authorizing Provider:  Wallene Huh  Resting Radionuclide: Technetium 76m Sestamibi  Resting Radionuclide Dose: 11.0 mCi   Stress Radionuclide:  Technetium 37m Sestamibi  Stress Radionuclide Dose: 33.0 mCi           Stress Protocol Rest HR: 61 Stress HR: 136  Rest BP: 145/75 Stress BP: 181/54  Exercise Time (min): 8:30 METS: 10.1           Dose of Adenosine (mg):  n/a Dose of Lexiscan: n/a mg  Dose of Atropine (mg): n/a Dose of Dobutamine: n/a mcg/kg/min (at max HR)  Stress Test Technologist: Glade Lloyd, BS-ES  Nuclear Technologist:  Charlton Amor, CNMT     Rest Procedure:  Myocardial perfusion imaging was performed at rest 45 minutes following the intravenous administration of Technetium 79m Sestamibi. Rest ECG: NSR - Normal EKG  Stress Procedure:  The patient exercised on the treadmill utilizing the Bruce Protocol for 8:30 minutes. The patient stopped due to SOB, fatigue and denied any chest pain.  Technetium  61m Sestamibi was injected at peak exercise and myocardial perfusion imaging was performed after a brief delay. Stress ECG: Horizontal 1.5 mm STD in the inferolateral leads that resolve 1 minute into recovery. Frequent PVCs, especialy in recovery.   QPS Raw Data Images:  There is interference from nuclear activity from structures below the diaphragm. This does not affect the ability to read the study. Stress Images:  Normal homogeneous uptake in all areas of the myocardium. Rest Images:  Normal homogeneous uptake in all areas of the myocardium. Subtraction (SDS):  No evidence of ischemia. Transient Ischemic Dilatation (Normal <1.22):  0.87 Lung/Heart Ratio (Normal <0.45):  0.29  Quantitative Gated Spect Images QGS EDV:  111 ml QGS ESV:  40 ml  Impression Exercise Capacity:  Excellent exercise capacity. BP Response:  Normal blood pressure response. Clinical Symptoms:  No significant symptoms noted. ECG Impression:  ST depressions that resolve quickly in recovery.  Comparison with Prior Nuclear Study: No previous nuclear study performed  Overall Impression:  Normal stress nuclear study.  LV Ejection Fraction: 64%.  LV Wall Motion:  NL LV Function; NL Wall Motion  Dorothy Spark 12/27/2013

## 2013-12-29 ENCOUNTER — Telehealth: Payer: Self-pay | Admitting: Cardiology

## 2013-12-29 NOTE — Telephone Encounter (Signed)
New message    Want nuclear stress test results.  OK to leave msg on vm.

## 2013-12-29 NOTE — Telephone Encounter (Signed)
Spoke with patient advise that the results are in, Dr. Marlou Porch has to read them, as soon as Dr. Marlou Porch reads them I will contact the patient with the results. Advised Dr. Marlou Porch is on vacation so it will be the middle of next week.

## 2014-01-14 ENCOUNTER — Other Ambulatory Visit: Payer: Self-pay | Admitting: Internal Medicine

## 2014-04-22 ENCOUNTER — Other Ambulatory Visit: Payer: Self-pay | Admitting: Internal Medicine

## 2014-06-25 ENCOUNTER — Other Ambulatory Visit: Payer: Self-pay | Admitting: Internal Medicine

## 2014-06-28 NOTE — Telephone Encounter (Signed)
Pt wife calling on status of refill for fluticasone that was to be called in by pharm on 06/25/14, pt;w wife says the pharm says they have not received it please advise, this is the [employer] [address]Hillery Hunter

## 2014-06-29 ENCOUNTER — Telehealth: Payer: Self-pay | Admitting: Internal Medicine

## 2014-06-29 NOTE — Telephone Encounter (Signed)
According to epic RX was [facility]/28/14 [facility], spoke w/ Sam. She reports they did receive it and is ready for pick up.  LMTCB x1

## 2014-06-30 NOTE — Telephone Encounter (Signed)
lmomtcb x 2  

## 2014-06-30 NOTE — Telephone Encounter (Signed)
lmtcb

## 2014-07-03 NOTE — Telephone Encounter (Signed)
Attempted to call pt x 4 times and no return call.  rx has been sent to the pharmacy.  i have left another message and advised the pt to call back if further assistance is needed.

## 2014-12-07 DIAGNOSIS — Z125 Encounter for screening for malignant neoplasm of prostate: Secondary | ICD-10-CM | POA: Diagnosis not present

## 2014-12-07 DIAGNOSIS — I10 Essential (primary) hypertension: Secondary | ICD-10-CM | POA: Diagnosis not present

## 2014-12-13 DIAGNOSIS — E780 Pure hypercholesterolemia: Secondary | ICD-10-CM | POA: Diagnosis not present

## 2014-12-13 DIAGNOSIS — Z125 Encounter for screening for malignant neoplasm of prostate: Secondary | ICD-10-CM | POA: Diagnosis not present

## 2014-12-13 DIAGNOSIS — I10 Essential (primary) hypertension: Secondary | ICD-10-CM | POA: Diagnosis not present

## 2014-12-28 ENCOUNTER — Other Ambulatory Visit: Payer: Self-pay | Admitting: Internal Medicine

## 2015-01-04 ENCOUNTER — Other Ambulatory Visit: Payer: Self-pay | Admitting: Internal Medicine

## 2015-06-20 DIAGNOSIS — H52203 Unspecified astigmatism, bilateral: Secondary | ICD-10-CM | POA: Diagnosis not present

## 2015-06-20 DIAGNOSIS — H3531 Nonexudative age-related macular degeneration: Secondary | ICD-10-CM | POA: Diagnosis not present

## 2015-08-13 DIAGNOSIS — I10 Essential (primary) hypertension: Secondary | ICD-10-CM | POA: Diagnosis not present

## 2015-08-13 DIAGNOSIS — Z125 Encounter for screening for malignant neoplasm of prostate: Secondary | ICD-10-CM | POA: Diagnosis not present

## 2015-08-20 DIAGNOSIS — M109 Gout, unspecified: Secondary | ICD-10-CM | POA: Diagnosis not present

## 2015-08-20 DIAGNOSIS — I10 Essential (primary) hypertension: Secondary | ICD-10-CM | POA: Diagnosis not present

## 2015-08-20 DIAGNOSIS — E7800 Pure hypercholesterolemia, unspecified: Secondary | ICD-10-CM | POA: Diagnosis not present

## 2015-08-20 DIAGNOSIS — Z125 Encounter for screening for malignant neoplasm of prostate: Secondary | ICD-10-CM | POA: Diagnosis not present

## 2015-12-28 ENCOUNTER — Other Ambulatory Visit: Payer: Self-pay | Admitting: Internal Medicine

## 2016-02-05 DIAGNOSIS — Z125 Encounter for screening for malignant neoplasm of prostate: Secondary | ICD-10-CM | POA: Diagnosis not present

## 2016-02-05 DIAGNOSIS — I10 Essential (primary) hypertension: Secondary | ICD-10-CM | POA: Diagnosis not present

## 2016-02-05 DIAGNOSIS — M109 Gout, unspecified: Secondary | ICD-10-CM | POA: Diagnosis not present

## 2016-02-20 DIAGNOSIS — E7800 Pure hypercholesterolemia, unspecified: Secondary | ICD-10-CM | POA: Diagnosis not present

## 2016-02-20 DIAGNOSIS — Z125 Encounter for screening for malignant neoplasm of prostate: Secondary | ICD-10-CM | POA: Diagnosis not present

## 2016-02-20 DIAGNOSIS — M109 Gout, unspecified: Secondary | ICD-10-CM | POA: Diagnosis not present

## 2016-02-20 DIAGNOSIS — I10 Essential (primary) hypertension: Secondary | ICD-10-CM | POA: Diagnosis not present

## 2016-06-05 DIAGNOSIS — M47814 Spondylosis without myelopathy or radiculopathy, thoracic region: Secondary | ICD-10-CM | POA: Diagnosis not present

## 2016-06-05 DIAGNOSIS — Z23 Encounter for immunization: Secondary | ICD-10-CM | POA: Diagnosis not present

## 2016-06-05 DIAGNOSIS — E7800 Pure hypercholesterolemia, unspecified: Secondary | ICD-10-CM | POA: Diagnosis not present

## 2016-06-05 DIAGNOSIS — M47816 Spondylosis without myelopathy or radiculopathy, lumbar region: Secondary | ICD-10-CM | POA: Diagnosis not present

## 2016-06-05 DIAGNOSIS — M545 Low back pain: Secondary | ICD-10-CM | POA: Diagnosis not present

## 2016-06-05 DIAGNOSIS — M109 Gout, unspecified: Secondary | ICD-10-CM | POA: Diagnosis not present

## 2016-06-05 DIAGNOSIS — I10 Essential (primary) hypertension: Secondary | ICD-10-CM | POA: Diagnosis not present

## 2016-06-23 DIAGNOSIS — H52203 Unspecified astigmatism, bilateral: Secondary | ICD-10-CM | POA: Diagnosis not present

## 2016-06-23 DIAGNOSIS — H353131 Nonexudative age-related macular degeneration, bilateral, early dry stage: Secondary | ICD-10-CM | POA: Diagnosis not present

## 2016-06-23 DIAGNOSIS — H2513 Age-related nuclear cataract, bilateral: Secondary | ICD-10-CM | POA: Diagnosis not present

## 2016-08-13 DIAGNOSIS — Z125 Encounter for screening for malignant neoplasm of prostate: Secondary | ICD-10-CM | POA: Diagnosis not present

## 2016-08-13 DIAGNOSIS — I10 Essential (primary) hypertension: Secondary | ICD-10-CM | POA: Diagnosis not present

## 2016-08-22 DIAGNOSIS — Z125 Encounter for screening for malignant neoplasm of prostate: Secondary | ICD-10-CM | POA: Diagnosis not present

## 2016-08-22 DIAGNOSIS — E7800 Pure hypercholesterolemia, unspecified: Secondary | ICD-10-CM | POA: Diagnosis not present

## 2016-08-22 DIAGNOSIS — R200 Anesthesia of skin: Secondary | ICD-10-CM | POA: Diagnosis not present

## 2016-08-22 DIAGNOSIS — I10 Essential (primary) hypertension: Secondary | ICD-10-CM | POA: Diagnosis not present

## 2017-01-15 DIAGNOSIS — M5410 Radiculopathy, site unspecified: Secondary | ICD-10-CM | POA: Diagnosis not present

## 2017-01-15 DIAGNOSIS — M25552 Pain in left hip: Secondary | ICD-10-CM | POA: Diagnosis not present

## 2017-01-15 DIAGNOSIS — M545 Low back pain: Secondary | ICD-10-CM | POA: Diagnosis not present

## 2017-01-26 ENCOUNTER — Other Ambulatory Visit: Payer: Self-pay | Admitting: Internal Medicine

## 2017-01-26 ENCOUNTER — Ambulatory Visit
Admission: RE | Admit: 2017-01-26 | Discharge: 2017-01-26 | Disposition: A | Discharge status: Home / Self Care | Payer: Medicare Other | Source: Ambulatory Visit | Attending: Internal Medicine | Admitting: Internal Medicine

## 2017-01-26 DIAGNOSIS — M5416 Radiculopathy, lumbar region: Secondary | ICD-10-CM

## 2017-01-26 DIAGNOSIS — M48061 Spinal stenosis, lumbar region without neurogenic claudication: Secondary | ICD-10-CM | POA: Diagnosis not present

## 2017-02-02 ENCOUNTER — Other Ambulatory Visit: Payer: Self-pay | Admitting: Neurosurgery

## 2017-02-02 DIAGNOSIS — M48062 Spinal stenosis, lumbar region with neurogenic claudication: Secondary | ICD-10-CM | POA: Insufficient documentation

## 2017-02-02 DIAGNOSIS — M51369 Other intervertebral disc degeneration, lumbar region without mention of lumbar back pain or lower extremity pain: Secondary | ICD-10-CM | POA: Insufficient documentation

## 2017-02-02 DIAGNOSIS — M47816 Spondylosis without myelopathy or radiculopathy, lumbar region: Secondary | ICD-10-CM | POA: Insufficient documentation

## 2017-02-02 DIAGNOSIS — M5136 Other intervertebral disc degeneration, lumbar region: Secondary | ICD-10-CM | POA: Diagnosis not present

## 2017-02-02 DIAGNOSIS — M5416 Radiculopathy, lumbar region: Secondary | ICD-10-CM | POA: Insufficient documentation

## 2017-02-09 ENCOUNTER — Encounter (HOSPITAL_COMMUNITY): Payer: Self-pay

## 2017-02-09 ENCOUNTER — Encounter (HOSPITAL_COMMUNITY)
Admission: RE | Admit: 2017-02-09 | Discharge: 2017-02-09 | Disposition: A | Discharge status: Home / Self Care | Payer: Medicare Other | Source: Ambulatory Visit | Attending: Neurosurgery | Admitting: Neurosurgery

## 2017-02-09 DIAGNOSIS — E785 Hyperlipidemia, unspecified: Secondary | ICD-10-CM | POA: Diagnosis not present

## 2017-02-09 DIAGNOSIS — G9741 Accidental puncture or laceration of dura during a procedure: Secondary | ICD-10-CM | POA: Diagnosis not present

## 2017-02-09 DIAGNOSIS — M48061 Spinal stenosis, lumbar region without neurogenic claudication: Secondary | ICD-10-CM | POA: Diagnosis not present

## 2017-02-09 DIAGNOSIS — Z801 Family history of malignant neoplasm of trachea, bronchus and lung: Secondary | ICD-10-CM | POA: Diagnosis not present

## 2017-02-09 DIAGNOSIS — M4726 Other spondylosis with radiculopathy, lumbar region: Secondary | ICD-10-CM | POA: Diagnosis not present

## 2017-02-09 DIAGNOSIS — I10 Essential (primary) hypertension: Secondary | ICD-10-CM | POA: Diagnosis not present

## 2017-02-09 DIAGNOSIS — M7138 Other bursal cyst, other site: Secondary | ICD-10-CM | POA: Diagnosis not present

## 2017-02-09 DIAGNOSIS — Z87442 Personal history of urinary calculi: Secondary | ICD-10-CM | POA: Diagnosis not present

## 2017-02-09 HISTORY — DX: Personal history of urinary calculi: Z87.442

## 2017-02-09 LAB — BASIC METABOLIC PANEL
Anion gap: 9 (ref 5–15)
BUN: 15 mg/dL (ref 6–20)
CO2: 25 mmol/L (ref 22–32)
Calcium: 9.1 mg/dL (ref 8.9–10.3)
Chloride: 104 mmol/L (ref 101–111)
Creatinine, Ser: 1.27 mg/dL — ABNORMAL HIGH (ref 0.61–1.24)
GFR calc Af Amer: >60 mL/min (ref >60)
GFR calc non Af Amer: 56 mL/min — ABNORMAL LOW (ref >60)
Glucose, Bld: 96 mg/dL (ref 65–99)
Potassium: 4 mmol/L (ref 3.5–5.1)
Sodium: 138 mmol/L (ref 135–145)

## 2017-02-09 LAB — SURGICAL PCR SCREEN
MRSA, PCR: NEGATIVE
Staphylococcus aureus: POSITIVE — AB

## 2017-02-09 LAB — CBC
HCT: 44 % (ref 39.0–52.0)
Hemoglobin: 14.9 g/dL (ref 13.0–17.0)
MCH: 32.6 pg (ref 26.0–34.0)
MCHC: 33.9 g/dL (ref 30.0–36.0)
MCV: 96.3 fL (ref 78.0–100.0)
Platelets: 174 10*3/uL (ref 150–400)
RBC: 4.57 MIL/uL (ref 4.22–5.81)
RDW: 12.9 % (ref 11.5–15.5)
WBC: 7.4 10*3/uL (ref 4.0–10.5)

## 2017-02-09 LAB — TYPE AND SCREEN
ABO/RH(D): O POS
Antibody Screen: NEGATIVE

## 2017-02-09 LAB — ABO/RH: ABO/RH(D): O POS

## 2017-02-09 MED ORDER — CHLORHEXIDINE GLUCONATE CLOTH 2 % EX PADS: 6.0000 | MEDICATED_PAD | Freq: Once | CUTANEOUS | Status: DC

## 2017-02-09 NOTE — Pre-Procedure Instructions (Signed)
    Arlys John Jr.  02/09/2017      [id number] [place] [place] [address] Phone: [phone] Fax: [phone]    Your procedure is scheduled on 02/11/17.  Report to [facility] Admitting at 950 A.M.  Call this number if you have problems the morning of surgery:  [phone]   Remember:  Do not eat food or drink liquids after midnight.  Take these medicines the morning of surgery with A SIP OF WATER --tylenol,allopurinol,amlodipine,bystolic,zantac   Do not wear jewelry, make-up or nail polish.  Do not wear lotions, powders, or perfumes, or deoderant.  Do not shave 48 hours prior to surgery.  Men may shave face and neck.  Do not bring valuables to the hospital.  [facility] is not responsible for any belongings or valuables.  Contacts, dentures or bridgework may not be worn into surgery.  Leave your suitcase in the car.  After surgery it may be brought to your room.  For patients admitted to the hospital, discharge time will be determined by your treatment team.  Patients discharged the day of surgery will not be allowed to drive home Name and phone number of your driver:    Special instructions:  Do not take any aspirin,anti-inflammatories,vitamins,or herbal supplements 5-7 days prior to surgery.  Please read over the following fact sheets that you were given. MRSA Information

## 2017-02-10 NOTE — Progress Notes (Signed)
Anesthesia Chart Review:  Pt is a 71 year old male scheduled for L4-5 laminotomy, foraminotomy, and possible microdiscectomy on 02/11/2017 with Jovita Gamma, M.D.  - PCP is Jani Gravel, MD - Saw cardiologist Candee Furbish, MD in [phone] for incidental finding of coronary calcifications on CT. Stress test ordered, results below (normal).  Pt does not routinely see cardiology.   PMH includes: HTN, hyperlipidemia. Never smoker. BMI 33.  Medications include: Amlodipine, ASA 81 mg, Lipitor, nebivolol, Zantac  Preoperative labs reviewed.  EKG 02/11/17: Sinus rhythm with frequent and consecutive PVCs  Nuclear stress test 12/27/13: Normal stress nuclear study. LV Ejection Fraction: 64%.  LV Wall Motion:  NL LV Function; NL Wall Motion  Reviewed case with Dr. Jenita Seashore. If no changes, I anticipate pt can proceed with surgery as scheduled.   Willeen Cass, FNP-BC [facility]/Anesthesiology Phone: [phone] 02/10/2017 12:11 PM

## 2017-02-11 ENCOUNTER — Ambulatory Visit (HOSPITAL_COMMUNITY): Payer: Medicare Other | Admitting: Anesthesiology

## 2017-02-11 ENCOUNTER — Encounter (HOSPITAL_COMMUNITY): Payer: Self-pay | Admitting: Urology

## 2017-02-11 ENCOUNTER — Ambulatory Visit (HOSPITAL_COMMUNITY): Payer: Medicare Other

## 2017-02-11 ENCOUNTER — Ambulatory Visit (HOSPITAL_COMMUNITY): Payer: Medicare Other | Admitting: Emergency Medicine

## 2017-02-11 ENCOUNTER — Inpatient Hospital Stay (HOSPITAL_COMMUNITY)
Admission: RE | Admit: 2017-02-11 | Discharge: 2017-02-15 | DRG: 519 | Disposition: A | Discharge status: Home / Self Care | Payer: Medicare Other | Source: Ambulatory Visit | Attending: Neurosurgery | Admitting: Neurosurgery

## 2017-02-11 ENCOUNTER — Encounter (HOSPITAL_COMMUNITY)
Admission: RE | Disposition: A | Discharge status: Home / Self Care | Payer: Self-pay | Source: Ambulatory Visit | Attending: Neurosurgery

## 2017-02-11 DIAGNOSIS — Z801 Family history of malignant neoplasm of trachea, bronchus and lung: Secondary | ICD-10-CM | POA: Diagnosis not present

## 2017-02-11 DIAGNOSIS — E785 Hyperlipidemia, unspecified: Secondary | ICD-10-CM | POA: Diagnosis present

## 2017-02-11 DIAGNOSIS — Z87442 Personal history of urinary calculi: Secondary | ICD-10-CM | POA: Diagnosis not present

## 2017-02-11 DIAGNOSIS — M109 Gout, unspecified: Secondary | ICD-10-CM | POA: Diagnosis not present

## 2017-02-11 DIAGNOSIS — M5136 Other intervertebral disc degeneration, lumbar region: Secondary | ICD-10-CM | POA: Diagnosis not present

## 2017-02-11 DIAGNOSIS — M48062 Spinal stenosis, lumbar region with neurogenic claudication: Secondary | ICD-10-CM | POA: Diagnosis not present

## 2017-02-11 DIAGNOSIS — I10 Essential (primary) hypertension: Secondary | ICD-10-CM | POA: Diagnosis present

## 2017-02-11 DIAGNOSIS — M5106 Intervertebral disc disorders with myelopathy, lumbar region: Secondary | ICD-10-CM | POA: Diagnosis not present

## 2017-02-11 DIAGNOSIS — M7138 Other bursal cyst, other site: Principal | ICD-10-CM | POA: Diagnosis present

## 2017-02-11 DIAGNOSIS — G9741 Accidental puncture or laceration of dura during a procedure: Secondary | ICD-10-CM | POA: Diagnosis not present

## 2017-02-11 DIAGNOSIS — R05 Cough: Secondary | ICD-10-CM | POA: Diagnosis not present

## 2017-02-11 DIAGNOSIS — Y838 Other surgical procedures as the cause of abnormal reaction of the patient, or of later complication, without mention of misadventure at the time of the procedure: Secondary | ICD-10-CM | POA: Diagnosis present

## 2017-02-11 DIAGNOSIS — M4726 Other spondylosis with radiculopathy, lumbar region: Secondary | ICD-10-CM | POA: Diagnosis present

## 2017-02-11 DIAGNOSIS — Z419 Encounter for procedure for purposes other than remedying health state, unspecified: Secondary | ICD-10-CM

## 2017-02-11 DIAGNOSIS — M48061 Spinal stenosis, lumbar region without neurogenic claudication: Secondary | ICD-10-CM | POA: Diagnosis present

## 2017-02-11 HISTORY — PX: LUMBAR LAMINECTOMY/DECOMPRESSION MICRODISCECTOMY: SHX5026

## 2017-02-11 SURGERY — LUMBAR LAMINECTOMY/DECOMPRESSION MICRODISCECTOMY 1 LEVEL
Anesthesia: General | Laterality: Left

## 2017-02-11 MED ORDER — SUGAMMADEX SODIUM 200 MG/2ML IV SOLN
INTRAVENOUS | Status: AC
  Filled 2017-02-11: qty 2

## 2017-02-11 MED ORDER — LACTATED RINGERS IV SOLN
INTRAVENOUS | Status: DC
  Administered 2017-02-11: 10:00:00 via INTRAVENOUS

## 2017-02-11 MED ORDER — ROCURONIUM BROMIDE 100 MG/10ML IV SOLN
INTRAVENOUS | Status: DC | PRN
  Administered 2017-02-11: 50 mg via INTRAVENOUS
  Administered 2017-02-11: 10 mg via INTRAVENOUS
  Administered 2017-02-11: 20 mg via INTRAVENOUS
  Administered 2017-02-11: 5 mg via INTRAVENOUS

## 2017-02-11 MED ORDER — SODIUM CHLORIDE 0.9% FLUSH: 3.0000 mL | INTRAVENOUS | Status: DC | PRN

## 2017-02-11 MED ORDER — SUGAMMADEX SODIUM 200 MG/2ML IV SOLN
INTRAVENOUS | Status: DC | PRN
  Administered 2017-02-11: 200 mg via INTRAVENOUS

## 2017-02-11 MED ORDER — LIDOCAINE-EPINEPHRINE 1 %-1:100000 IJ SOLN
INTRAMUSCULAR | Status: DC | PRN
  Administered 2017-02-11: 20 mL

## 2017-02-11 MED ORDER — LACTATED RINGERS IV SOLN
INTRAVENOUS | Status: DC | PRN
  Administered 2017-02-11 (×2): via INTRAVENOUS

## 2017-02-11 MED ORDER — KCL IN DEXTROSE-NACL 20-5-0.45 MEQ/L-%-% IV SOLN
INTRAVENOUS | Status: DC
  Administered 2017-02-11: 18:00:00 via INTRAVENOUS
  Filled 2017-02-11 (×2): qty 1000

## 2017-02-11 MED ORDER — PHENYLEPHRINE HCL 10 MG/ML IJ SOLN
INTRAVENOUS | Status: DC | PRN
  Administered 2017-02-11: 25 ug/min via INTRAVENOUS

## 2017-02-11 MED ORDER — AMLODIPINE BESYLATE 5 MG PO TABS
5.0000 mg | ORAL_TABLET | Freq: Every evening | ORAL | Status: DC
  Administered 2017-02-12 – 2017-02-14 (×3): 5 mg via ORAL
  Filled 2017-02-11 (×3): qty 1

## 2017-02-11 MED ORDER — ONDANSETRON HCL 4 MG/2ML IJ SOLN
INTRAMUSCULAR | Status: DC | PRN
  Administered 2017-02-11: 4 mg via INTRAVENOUS

## 2017-02-11 MED ORDER — GELATIN ABSORBABLE MT POWD
OROMUCOSAL | Status: DC | PRN
  Administered 2017-02-11: 12:00:00 via TOPICAL

## 2017-02-11 MED ORDER — ACETAMINOPHEN 10 MG/ML IV SOLN
INTRAVENOUS | Status: DC | PRN
  Administered 2017-02-11: 1000 mg via INTRAVENOUS

## 2017-02-11 MED ORDER — HYDROCODONE-ACETAMINOPHEN 5-325 MG PO TABS
1.0000 | ORAL_TABLET | ORAL | Status: DC | PRN
  Administered 2017-02-14 (×2): 1 via ORAL
  Filled 2017-02-11 (×2): qty 1

## 2017-02-11 MED ORDER — LIDOCAINE 2% (20 MG/ML) 5 ML SYRINGE
INTRAMUSCULAR | Status: AC
  Filled 2017-02-11: qty 5

## 2017-02-11 MED ORDER — VITAMIN D3 25 MCG (1000 UNIT) PO TABS
1000.0000 [IU] | ORAL_TABLET | Freq: Every evening | ORAL | Status: DC
  Administered 2017-02-11 – 2017-02-14 (×4): 1000 [IU] via ORAL
  Filled 2017-02-11 (×9): qty 1

## 2017-02-11 MED ORDER — ACETAMINOPHEN 325 MG PO TABS
650.0000 mg | ORAL_TABLET | ORAL | Status: DC | PRN
  Administered 2017-02-12 – 2017-02-14 (×2): 650 mg via ORAL
  Filled 2017-02-11 (×3): qty 2

## 2017-02-11 MED ORDER — DEXAMETHASONE SODIUM PHOSPHATE 10 MG/ML IJ SOLN
INTRAMUSCULAR | Status: AC
  Filled 2017-02-11: qty 1

## 2017-02-11 MED ORDER — SODIUM CHLORIDE 0.9% FLUSH
3.0000 mL | Freq: Two times a day (BID) | INTRAVENOUS | Status: DC
  Administered 2017-02-12 – 2017-02-15 (×6): 3 mL via INTRAVENOUS

## 2017-02-11 MED ORDER — ONDANSETRON HCL 4 MG/2ML IJ SOLN
INTRAMUSCULAR | Status: AC
  Filled 2017-02-11: qty 2

## 2017-02-11 MED ORDER — LIDOCAINE HCL (CARDIAC) 20 MG/ML IV SOLN
INTRAVENOUS | Status: DC | PRN
  Administered 2017-02-11: 60 mg via INTRAVENOUS

## 2017-02-11 MED ORDER — FLEET ENEMA 7-19 GM/118ML RE ENEM: 1.0000 | ENEMA | Freq: Once | RECTAL | Status: DC | PRN

## 2017-02-11 MED ORDER — METHYLPREDNISOLONE ACETATE 80 MG/ML IJ SUSP
INTRAMUSCULAR | Status: AC
  Filled 2017-02-11: qty 1

## 2017-02-11 MED ORDER — HYDROXYZINE HCL 50 MG/ML IM SOLN: 50.0000 mg | INTRAMUSCULAR | Status: DC | PRN

## 2017-02-11 MED ORDER — HYDROMORPHONE HCL 1 MG/ML IJ SOLN
0.2500 mg | INTRAMUSCULAR | Status: DC | PRN
Start: 2017-02-11 — End: 2017-02-11

## 2017-02-11 MED ORDER — ONDANSETRON HCL 4 MG/2ML IJ SOLN: 4.0000 mg | Freq: Four times a day (QID) | INTRAMUSCULAR | Status: DC | PRN

## 2017-02-11 MED ORDER — SODIUM CHLORIDE 0.9 % IR SOLN
Status: DC | PRN
  Administered 2017-02-11: 12:00:00

## 2017-02-11 MED ORDER — MIDAZOLAM HCL 2 MG/2ML IJ SOLN
INTRAMUSCULAR | Status: AC
  Filled 2017-02-11: qty 2

## 2017-02-11 MED ORDER — HEMOSTATIC AGENTS (NO CHARGE) OPTIME
TOPICAL | Status: DC | PRN
  Administered 2017-02-11: 1 via TOPICAL

## 2017-02-11 MED ORDER — DEXAMETHASONE SODIUM PHOSPHATE 10 MG/ML IJ SOLN
INTRAMUSCULAR | Status: DC | PRN
  Administered 2017-02-11: 10 mg via INTRAVENOUS

## 2017-02-11 MED ORDER — ONDANSETRON HCL 4 MG PO TABS: 4.0000 mg | ORAL_TABLET | Freq: Four times a day (QID) | ORAL | Status: DC | PRN

## 2017-02-11 MED ORDER — HYDROXYZINE HCL 50 MG PO TABS
50.0000 mg | ORAL_TABLET | ORAL | Status: DC | PRN
  Filled 2017-02-11: qty 1

## 2017-02-11 MED ORDER — PHENOL 1.4 % MT LIQD: 1.0000 | OROMUCOSAL | Status: DC | PRN

## 2017-02-11 MED ORDER — CEFAZOLIN SODIUM-DEXTROSE 2-4 GM/100ML-% IV SOLN
INTRAVENOUS | Status: AC
  Filled 2017-02-11: qty 100

## 2017-02-11 MED ORDER — BISACODYL 10 MG RE SUPP: 10.0000 mg | Freq: Every day | RECTAL | Status: DC | PRN

## 2017-02-11 MED ORDER — MORPHINE SULFATE (PF) 4 MG/ML IV SOLN: 4.0000 mg | INTRAVENOUS | Status: DC | PRN

## 2017-02-11 MED ORDER — KETOROLAC TROMETHAMINE 30 MG/ML IJ SOLN: 15.0000 mg | Freq: Once | INTRAMUSCULAR | Status: DC

## 2017-02-11 MED ORDER — PHENYLEPHRINE 40 MCG/ML (10ML) SYRINGE FOR IV PUSH (FOR BLOOD PRESSURE SUPPORT)
PREFILLED_SYRINGE | INTRAVENOUS | Status: DC | PRN
  Administered 2017-02-11 (×2): 120 ug via INTRAVENOUS

## 2017-02-11 MED ORDER — ACETAMINOPHEN 650 MG RE SUPP: 650.0000 mg | RECTAL | Status: DC | PRN

## 2017-02-11 MED ORDER — PROPOFOL 10 MG/ML IV BOLUS
INTRAVENOUS | Status: AC
  Filled 2017-02-11: qty 20

## 2017-02-11 MED ORDER — PROPOFOL 10 MG/ML IV BOLUS
INTRAVENOUS | Status: DC | PRN
  Administered 2017-02-11: 170 mg via INTRAVENOUS

## 2017-02-11 MED ORDER — BUPIVACAINE HCL (PF) 0.5 % IJ SOLN
INTRAMUSCULAR | Status: AC
  Filled 2017-02-11: qty 30

## 2017-02-11 MED ORDER — MAGNESIUM HYDROXIDE 400 MG/5ML PO SUSP
30.0000 mL | Freq: Every day | ORAL | Status: DC | PRN
  Administered 2017-02-12: 30 mL via ORAL
  Filled 2017-02-11: qty 30

## 2017-02-11 MED ORDER — FENTANYL CITRATE (PF) 250 MCG/5ML IJ SOLN
INTRAMUSCULAR | Status: AC
  Filled 2017-02-11: qty 5

## 2017-02-11 MED ORDER — NEBIVOLOL HCL 2.5 MG PO TABS
2.5000 mg | ORAL_TABLET | Freq: Every evening | ORAL | Status: DC
  Administered 2017-02-12 – 2017-02-14 (×3): 2.5 mg via ORAL
  Filled 2017-02-11 (×4): qty 1

## 2017-02-11 MED ORDER — ACETAMINOPHEN 10 MG/ML IV SOLN
INTRAVENOUS | Status: AC
  Filled 2017-02-11: qty 100

## 2017-02-11 MED ORDER — FENTANYL CITRATE (PF) 100 MCG/2ML IJ SOLN
INTRAMUSCULAR | Status: DC | PRN
  Administered 2017-02-11: 25 ug via INTRAVENOUS
  Administered 2017-02-11: 100 ug via INTRAVENOUS
  Administered 2017-02-11: 50 ug via INTRAVENOUS
  Administered 2017-02-11: 25 ug via INTRAVENOUS
  Administered 2017-02-11: 50 ug via INTRAVENOUS

## 2017-02-11 MED ORDER — CEFAZOLIN SODIUM-DEXTROSE 2-4 GM/100ML-% IV SOLN
2.0000 g | INTRAVENOUS | Status: AC
  Administered 2017-02-11: 2 g via INTRAVENOUS

## 2017-02-11 MED ORDER — THROMBIN 5000 UNITS EX SOLR
CUTANEOUS | Status: AC
  Filled 2017-02-11: qty 15000

## 2017-02-11 MED ORDER — BUPIVACAINE HCL (PF) 0.5 % IJ SOLN
INTRAMUSCULAR | Status: DC | PRN
  Administered 2017-02-11: 20 mL

## 2017-02-11 MED ORDER — KETOROLAC TROMETHAMINE 30 MG/ML IJ SOLN
15.0000 mg | Freq: Four times a day (QID) | INTRAMUSCULAR | Status: AC
  Administered 2017-02-11 – 2017-02-13 (×8): 15 mg via INTRAVENOUS
  Filled 2017-02-11 (×8): qty 1

## 2017-02-11 MED ORDER — SODIUM CHLORIDE 0.9 % IV SOLN: 250.0000 mL | INTRAVENOUS | Status: DC

## 2017-02-11 MED ORDER — CYCLOBENZAPRINE HCL 10 MG PO TABS
5.0000 mg | ORAL_TABLET | Freq: Three times a day (TID) | ORAL | Status: DC | PRN
  Administered 2017-02-14 (×3): 10 mg via ORAL
  Filled 2017-02-11: qty 2
  Filled 2017-02-11 (×2): qty 1
  Filled 2017-02-11 (×2): qty 2

## 2017-02-11 MED ORDER — THROMBIN 5000 UNITS EX SOLR
CUTANEOUS | Status: DC | PRN
  Administered 2017-02-11 (×2): 5000 [IU] via TOPICAL

## 2017-02-11 MED ORDER — LIDOCAINE-EPINEPHRINE 1 %-1:100000 IJ SOLN
INTRAMUSCULAR | Status: AC
  Filled 2017-02-11: qty 1

## 2017-02-11 MED ORDER — ROCURONIUM BROMIDE 10 MG/ML (PF) SYRINGE
PREFILLED_SYRINGE | INTRAVENOUS | Status: AC
  Filled 2017-02-11: qty 5

## 2017-02-11 MED ORDER — MIDAZOLAM HCL 5 MG/5ML IJ SOLN
INTRAMUSCULAR | Status: DC | PRN
  Administered 2017-02-11 (×2): 1 mg via INTRAVENOUS

## 2017-02-11 MED ORDER — ALUM & MAG HYDROXIDE-SIMETH 200-200-20 MG/5ML PO SUSP: 30.0000 mL | Freq: Four times a day (QID) | ORAL | Status: DC | PRN

## 2017-02-11 MED ORDER — MENTHOL 3 MG MT LOZG: 1.0000 | LOZENGE | OROMUCOSAL | Status: DC | PRN

## 2017-02-11 MED ORDER — 0.9 % SODIUM CHLORIDE (POUR BTL) OPTIME
TOPICAL | Status: DC | PRN
  Administered 2017-02-11: 1000 mL

## 2017-02-11 MED ORDER — ATORVASTATIN CALCIUM 10 MG PO TABS
10.0000 mg | ORAL_TABLET | Freq: Every day | ORAL | Status: DC
  Administered 2017-02-11 – 2017-02-14 (×4): 10 mg via ORAL
  Filled 2017-02-11 (×4): qty 1

## 2017-02-11 MED ORDER — ALLOPURINOL 300 MG PO TABS
300.0000 mg | ORAL_TABLET | Freq: Every evening | ORAL | Status: DC
  Administered 2017-02-12 – 2017-02-14 (×3): 300 mg via ORAL
  Filled 2017-02-11: qty 1
  Filled 2017-02-11 (×2): qty 3
  Filled 2017-02-11: qty 1
  Filled 2017-02-11: qty 3
  Filled 2017-02-11 (×2): qty 1

## 2017-02-11 MED ORDER — FOLIC ACID 1 MG PO TABS
1.0000 mg | ORAL_TABLET | Freq: Every evening | ORAL | Status: DC
  Administered 2017-02-11 – 2017-02-14 (×4): 1 mg via ORAL
  Filled 2017-02-11 (×4): qty 1

## 2017-02-11 SURGICAL SUPPLY — 66 items
ADH SKN CLS APL DERMABOND .7 (GAUZE/BANDAGES/DRESSINGS) ×1
APL SKNCLS STERI-STRIP NONHPOA (GAUZE/BANDAGES/DRESSINGS)
BAG DECANTER FOR FLEXI CONT (MISCELLANEOUS) ×2 IMPLANT
BENZOIN TINCTURE PRP APPL 2/3 (GAUZE/BANDAGES/DRESSINGS) IMPLANT
BLADE CLIPPER SURG (BLADE) ×1 IMPLANT
BUR ACORN 6.0 ACORN (BURR) IMPLANT
BUR ACRON 5.0MM COATED (BURR) ×1 IMPLANT
BUR MATCHSTICK NEURO 3.0 LAGG (BURR) ×2 IMPLANT
CANISTER SUCT 3000ML PPV (MISCELLANEOUS) ×2 IMPLANT
CARTRIDGE OIL MAESTRO DRILL (MISCELLANEOUS) ×1 IMPLANT
DERMABOND ADVANCED (GAUZE/BANDAGES/DRESSINGS) ×1
DERMABOND ADVANCED .7 DNX12 (GAUZE/BANDAGES/DRESSINGS) IMPLANT
DIFFUSER DRILL AIR PNEUMATIC (MISCELLANEOUS) ×2 IMPLANT
DRAPE LAPAROTOMY 100X72X124 (DRAPES) ×2 IMPLANT
DRAPE MICROSCOPE LEICA (MISCELLANEOUS) ×2 IMPLANT
DRAPE POUCH INSTRU U-SHP 10X18 (DRAPES) ×2 IMPLANT
ELECT REM PT RETURN 9FT ADLT (ELECTROSURGICAL) ×2
ELECTRODE REM PT RTRN 9FT ADLT (ELECTROSURGICAL) ×1 IMPLANT
GAUZE SPONGE 4X4 12PLY STRL (GAUZE/BANDAGES/DRESSINGS) ×1 IMPLANT
GAUZE SPONGE 4X4 16PLY XRAY LF (GAUZE/BANDAGES/DRESSINGS) ×1 IMPLANT
GLOVE BIOGEL PI IND STRL 8 (GLOVE) ×1 IMPLANT
GLOVE BIOGEL PI INDICATOR 8 (GLOVE) ×1
GLOVE ECLIPSE 7.5 STRL STRAW (GLOVE) ×5 IMPLANT
GLOVE EXAM NITRILE LRG STRL (GLOVE) IMPLANT
GLOVE EXAM NITRILE XL STR (GLOVE) IMPLANT
GLOVE EXAM NITRILE XS STR PU (GLOVE) IMPLANT
GLOVE INDICATOR 7.5 STRL GRN (GLOVE) ×2 IMPLANT
GLOVE INDICATOR 8.0 STRL GRN (GLOVE) ×1 IMPLANT
GLOVE SS BIOGEL STRL SZ 7.5 (GLOVE) IMPLANT
GLOVE SUPERSENSE BIOGEL SZ 7.5 (GLOVE) ×1
GOWN STRL REUS W/ TWL LRG LVL3 (GOWN DISPOSABLE) ×1 IMPLANT
GOWN STRL REUS W/ TWL XL LVL3 (GOWN DISPOSABLE) IMPLANT
GOWN STRL REUS W/TWL 2XL LVL3 (GOWN DISPOSABLE) IMPLANT
GOWN STRL REUS W/TWL LRG LVL3 (GOWN DISPOSABLE) ×2
GOWN STRL REUS W/TWL XL LVL3 (GOWN DISPOSABLE) ×2
GRAFT DURAGEN MATRIX 1WX1L (Tissue) ×1 IMPLANT
KIT BASIN OR (CUSTOM PROCEDURE TRAY) ×2 IMPLANT
KIT ROOM TURNOVER OR (KITS) ×2 IMPLANT
NDL HYPO 18GX1.5 BLUNT FILL (NEEDLE) IMPLANT
NDL SPNL 18GX3.5 QUINCKE PK (NEEDLE) ×1 IMPLANT
NDL SPNL 22GX3.5 QUINCKE BK (NEEDLE) ×1 IMPLANT
NEEDLE HYPO 18GX1.5 BLUNT FILL (NEEDLE) IMPLANT
NEEDLE SPNL 18GX3.5 QUINCKE PK (NEEDLE) ×2 IMPLANT
NEEDLE SPNL 22GX3.5 QUINCKE BK (NEEDLE) ×2 IMPLANT
NS IRRIG 1000ML POUR BTL (IV SOLUTION) ×2 IMPLANT
OIL CARTRIDGE MAESTRO DRILL (MISCELLANEOUS) ×2
PACK LAMINECTOMY NEURO (CUSTOM PROCEDURE TRAY) ×2 IMPLANT
PAD ARMBOARD 7.5X6 YLW CONV (MISCELLANEOUS) ×6 IMPLANT
PATTIES SURGICAL .5 X.5 (GAUZE/BANDAGES/DRESSINGS) ×1 IMPLANT
PATTIES SURGICAL .5 X1 (DISPOSABLE) IMPLANT
RUBBERBAND STERILE (MISCELLANEOUS) ×4 IMPLANT
SEALANT ADHERUS EXTEND TIP (MISCELLANEOUS) ×1 IMPLANT
SPONGE LAP 4X18 X RAY DECT (DISPOSABLE) IMPLANT
SPONGE SURGIFOAM ABS GEL SZ50 (HEMOSTASIS) ×2 IMPLANT
STRIP CLOSURE SKIN 1/2X4 (GAUZE/BANDAGES/DRESSINGS) IMPLANT
SUT PROLENE 6 0 BV (SUTURE) ×2 IMPLANT
SUT VIC AB 1 CT1 18XBRD ANBCTR (SUTURE) ×1 IMPLANT
SUT VIC AB 1 CT1 8-18 (SUTURE) ×4
SUT VIC AB 2-0 CP2 18 (SUTURE) ×3 IMPLANT
SUT VIC AB 3-0 SH 8-18 (SUTURE) ×1 IMPLANT
SYR 5ML LL (SYRINGE) IMPLANT
TAPE CLOTH SURG 4X10 WHT LF (GAUZE/BANDAGES/DRESSINGS) ×1 IMPLANT
TOWEL GREEN STERILE (TOWEL DISPOSABLE) ×2 IMPLANT
TOWEL GREEN STERILE FF (TOWEL DISPOSABLE) ×2 IMPLANT
TRAY FOLEY CATH 16FRSI W/METER (SET/KITS/TRAYS/PACK) ×1 IMPLANT
WATER STERILE IRR 1000ML POUR (IV SOLUTION) ×2 IMPLANT

## 2017-02-11 NOTE — Progress Notes (Signed)
Vitals:   02/11/17 1615 02/11/17 1630 02/11/17 1634 02/11/17 1701  BP: 122/64 126/79  128/66  Pulse: 69 70  (!) 52  Resp: 14 19  19   Temp:   97 F (36.1 C) 97.6 F (36.4 C)  TempSrc:    Oral  SpO2: 100% 100%  100%  Weight:      Height:        CBC  Recent Labs  02/09/17 0927  WBC 7.4  HGB 14.9  HCT 44.0  PLT 174   BMET  Recent Labs  02/09/17 0927  NA 138  K 4.0  CL 104  CO2 25  GLUCOSE 96  BUN 15  CREATININE 1.27*  CALCIUM 9.1    Patient resting in bed. When I walked into the room, his bed was at 15 reverse Trendelenburg position. We returned to bed to 0, and I explained to the patient, his wife, and his nurse that we want him flat in bed, with the head of bed flat, and no reverse Trendelenburg. We also want him logrolling side to side with the nursing staff's or his family's assistance every few hours, and certainly for meals.  I spoke with the patient (and again with his wife, whom I spoke with immediately following surgery) about the intraoperative findings (partially calcified synovial cyst, dural tear, dural repair, etc.). I explained to them the plans for strict bedrest, with the head of bed flat and no reverse Trendelenburg, but with logrolling side to side, for the next several days. With a plan to then progress to gradually raising head of bed and progressively mobilizing. We also discussed limitations and restrictions following discharge regarding lifting, driving, etc.  The patient himself notes complete relief of the disabling left lumbar radicular pain, and he says at this point is really not having any incisional pain. I discussed pain management with his nurse.  Plan: Strict bedrest, head of bed flat, no reverse Trendelenburg. SCD in place.  Hosie Spangle, MD 02/11/2017, 7:25 PM

## 2017-02-11 NOTE — Transfer of Care (Signed)
Immediate Anesthesia Transfer of Care Note  Patient: Gregory Reid.  Procedure(s) Performed: Procedure(s): LEFT LUMBAR FOUR- LUMBAR FIVE LAMINOTOMY,FORAMINOTOMY,  MICRODISCECTOMY (Left)  Patient Location: PACU  Anesthesia Type:General  Level of Consciousness: sedated and responds to stimulation  Airway & Oxygen Therapy: Patient Spontanous Breathing and Patient connected to nasal cannula oxygen  Post-op Assessment: Report given to RN and Post -op Vital signs reviewed and stable  Post vital signs: Reviewed and stable  Last Vitals:  Vitals:   02/11/17 1001  BP: (!) 157/79  Pulse: 76  Resp: 20  Temp: 37.1 C    Last Pain:  Vitals:   02/11/17 1016  TempSrc:   PainSc: 1          Complications: No apparent anesthesia complications

## 2017-02-11 NOTE — Anesthesia Postprocedure Evaluation (Signed)
Anesthesia Post Note  Patient: Gregory Reid.  Procedure(s) Performed: Procedure(s) (LRB): LEFT LUMBAR FOUR- LUMBAR FIVE LAMINOTOMY,FORAMINOTOMY,  MICRODISCECTOMY (Left)  Patient location during evaluation: PACU Anesthesia Type: General Level of consciousness: awake and alert Pain management: pain level controlled Vital Signs Assessment: post-procedure vital signs reviewed and stable Respiratory status: spontaneous breathing, nonlabored ventilation, respiratory function stable and patient connected to nasal cannula oxygen Cardiovascular status: blood pressure returned to baseline and stable Postop Assessment: no signs of nausea or vomiting Anesthetic complications: no       Last Vitals:  Vitals:   02/11/17 1634 02/11/17 1701  BP:  128/66  Pulse:  (!) 52  Resp:  19  Temp: 36.1 C 36.4 C    Last Pain:  Vitals:   02/11/17 1701  TempSrc: Oral  PainSc:                  Gregory Reid,W. EDMOND

## 2017-02-11 NOTE — Op Note (Signed)
02/11/2017  3:03 PM  PATIENT:  Gregory Reid.  71 y.o. 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  ANESTHESIA:   general  EBL:  Total I/O In: 1600 [I.V.:1600] Out: 100 [Blood:100]  BLOOD ADMINISTERED:none  COUNT: Correct per nursing staff  DICTATION: Patient was brought to the operating room, placed under general endotracheal anesthesia. Patient was turned to prone position, the lumbar region was prepped with Betadine soap and solution and draped in a sterile fashion. A localizing x-ray was taken and the L4-5 level identified. The posterior midline was infiltrated with local aesthetic with epinephrine, and a midline incision made, carried down to the subcutaneous tissue. Bipolar cautery and electrocautery used to maintain hemostasis. Dissection was carried out the lumbar fascia which was incised on the left side of the midline, and the paraspinal musculature was dissected from the spinous processes and lamina in a subperiosteal fashion. The L4-5 intralaminar space was identified, additional x-rays were taken, and then the operating microscope was draped and brought in the field provided additional medication, illumination, and visualization and the remainder the decompression was performed using microdissection microsurgical technique.  We began with a left L4-5 laminotomy. The left L4-5 facet was arthropathic, hypertrophic, and somewhat loose. We found significant shingling of the L4 and L5 lamina. As we began to expose into the epidural space. We found a significant synovial cysts coming out of the arthritic left L4-5 facet joint. The laminotomy was extended both rostrally and caudally. Much of the synovial cyst was calcified, and as this was mobilized, it was found to be adherent to the thecal sac, and a small tear occurred in the lateral wall the thecal sac below the level of the takeoff of the L5 nerve root. We completed the removal of the rostral portion of the synovial cyst, decompressing the thecal sac at the level of the L4-5 disc space and rostral to that. Another x-ray was taken to confirm our localization at the L4-5 level. A foraminotomy was performed for the exiting left L5 nerve root. To adequately expose the dural tear to repair it, we performed a left L5 hemilaminectomy, so as to expose the dural tear from below. Where the dural tear was present, the remained a small amount of calcified synovial cyst membrane, densely adherent to the dura. It was felt that to remove that we would remove the dura along with it and therefore we closed the dural defect, leaving a portion of this calcified synovial cyst membrane adherent to the dura. We used 2 6-0 Prolene sutures to approximate the dura. We valsalva'd to a pressure of 30 mmHg, without CSF leakage being seen. Surgifoam was placed in the ventral lateral epidural space to establish hemostasis. A 1 in. pledget of DuraGen was placed over the dural repair, and then Adheris dural sealant was applied. We then proceeded with closure. The deep fascia was closed with interrupted undyed 1 Vicryl sutures, the Scarpa's fascia was closed with interrupted undyed 1 Vicryl sutures, and the subcutaneous  and subcuticular closed with interrupted inverted 2-0 undyed Vicryl sutures. Skin edges were  approximated with Dermabond. A dressing of sterile gauze and Hypafix was applied. Following surgery the patient was turned back to a supine position, with the head of bed flat, and a Foley catheter was placed. The patient is to be reversed from the anesthetic, extubated, and transferred to the recovery room for further care.  PLAN OF CARE: Admit to inpatient   PATIENT DISPOSITION:  PACU - hemodynamically stable.   Delay start of Pharmacological VTE agent (>24hrs) due to surgical blood loss or risk of bleeding:  yes

## 2017-02-11 NOTE — H&P (Signed)
Subjective: Patient is a 71 y.o. right-handed white male who is admitted for treatment of left lumbar radiculopathy.  Patient having difficulties with pain in the left side of his low back, radiating down to his lateral left buttock into his lateral left hip, and into the anterolateral left thigh and knee. He was treated with prednisone Dosepak without relief. He is comfortable when laying down, but pain with standing up and with activity. MRI scan shows a left L4-5 unilateral hypertrophic facet arthropathy with left L4-5 canal and lateral recess stenosis. There is some degenerative disc bulging, but is difficult to discern a distinct acute disc herniation. Patient is admitted now for a left L4-5 lumbar laminotomy, foraminotomy, and possible microdiscectomy.    Patient Active Problem List   Diagnosis Date Noted  . Fatty liver seen on CT chest and associated with obesity 12/09/2013  . Coronary artery calcification seen on CAT scan 12/09/2013  . Hypertension   . Hyperlipidemia   . Gout   . Nephrolithiasis   . Chronic cough 08/08/2013   Past Medical History:  Diagnosis Date  . Cough   . Gout   . History of kidney stones   . Hyperlipidemia   . Hypertension   . Nephrolithiasis     Past Surgical History:  Procedure Laterality Date  . FOOT SURGERY    . SHOULDER SURGERY      No prescriptions prior to admission.   Allergies  Allergen Reactions  . No Known Allergies     Social History  Substance Use Topics  . Smoking status: Never Smoker  . Smokeless tobacco: Not on file  . Alcohol use No     Comment: occ    Family History  Problem Relation Age of Onset  . Lung cancer Father      Review of Systems A comprehensive review of systems was negative.   EXAM: Patient well-developed well-nourished white male in no acute distress. Lungs are clear to auscultation , the patient has symmetrical respiratory excursion. Heart has a regular rate and rhythm normal S1 and S2 no murmur.    Abdomen is soft nontender nondistended bowel sounds are present. Extremity examination shows no clubbing cyanosis or edema. Motor examination shows 5 over 5 strength in the lower extremities including the iliopsoas quadriceps dorsiflexor extensor hallicus  longus and plantar flexor bilaterally. Sensation is intact to pinprick in the distal lower extremities. Reflexes are symmetrical bilaterally. No pathologic reflexes are present. Patient has a normal gait and stance.   Data Review:CBC    Component Value Date/Time   WBC 7.4 02/09/2017 0927   RBC 4.57 02/09/2017 0927   HGB 14.9 02/09/2017 0927   HCT 44.0 02/09/2017 0927   PLT 174 02/09/2017 0927   MCV 96.3 02/09/2017 0927   MCH 32.6 02/09/2017 0927   MCHC 33.9 02/09/2017 0927   RDW 12.9 02/09/2017 0927                          BMET    Component Value Date/Time   NA 138 02/09/2017 0927   K 4.0 02/09/2017 0927   CL 104 02/09/2017 0927   CO2 25 02/09/2017 0927   GLUCOSE 96 02/09/2017 0927   BUN 15 02/09/2017 0927   CREATININE 1.27 (H) 02/09/2017 0927   CALCIUM 9.1 02/09/2017 0927   GFRNONAA 56 (L) 02/09/2017 0927   GFRAA >60 02/09/2017 4034     Assessment/Plan: Patient with persistent left lumbar radiculopathy secondary to left L4-5 canal  and lateral recess stenosis secondary to hypertrophic facet arthropathy, ligament of flavum thickening, and possible superimposed disc herniation. Patient admitted now for a left L4-5 lumbar laminotomy, foraminotomy, and possible microdiscectomy.  I've discussed with the patient the nature of his condition, the nature the surgical procedure, the typical length of surgery, hospital stay, and overall recuperation. We discussed limitations postoperatively. I discussed risks of surgery including risks of infection, bleeding, possibly need for transfusion, the risk of nerve root dysfunction with pain, weakness, numbness, or paresthesias, or risk of dural tear and CSF leakage and possible need for further  surgery, the risk of recurrent disc herniation and the possible need for further surgery, and the risk of anesthetic complications including myocardial infarction, stroke, pneumonia, and death. Understanding all this the patient does wish to proceed with surgery and is admitted for such.    Hosie Spangle, MD 02/11/2017 8:03 AM

## 2017-02-11 NOTE — Anesthesia Procedure Notes (Signed)
Procedure Name: Intubation Date/Time: 02/11/2017 12:28 PM Performed by: Everlean Cherry A Pre-anesthesia Checklist: Patient identified, Emergency Drugs available, Suction available and Patient being monitored Patient Re-evaluated:Patient Re-evaluated prior to inductionOxygen Delivery Method: Circle system utilized Preoxygenation: Pre-oxygenation with 100% oxygen Intubation Type: IV induction Ventilation: Mask ventilation without difficulty and Oral airway inserted - appropriate to patient size Laryngoscope Size: Sabra Heck and 2 Grade View: Grade II Tube type: Oral Tube size: 7.5 mm Number of attempts: 1 Airway Equipment and Method: Stylet Placement Confirmation: ETT inserted through vocal cords under direct vision,  positive ETCO2 and breath sounds checked- equal and bilateral Secured at: 23 cm Tube secured with: Tape Dental Injury: Teeth and Oropharynx as per pre-operative assessment

## 2017-02-11 NOTE — Anesthesia Preprocedure Evaluation (Addendum)
Anesthesia Evaluation  Patient identified by MRN, date of birth, ID band Patient awake    Reviewed: Allergy & Precautions, H&P , NPO status , Patient's Chart, lab work & pertinent test results, reviewed documented beta blocker date and time   Airway Mallampati: III  TM Distance: >3 FB Neck ROM: Full    Dental no notable dental hx. (+) Teeth Intact, Dental Advisory Given   Pulmonary neg pulmonary ROS,    Pulmonary exam normal breath sounds clear to auscultation       Cardiovascular hypertension, Pt. on medications and Pt. on home beta blockers  Rhythm:Regular Rate:Normal     Neuro/Psych negative neurological ROS  negative psych ROS   GI/Hepatic negative GI ROS, Neg liver ROS,   Endo/Other  negative endocrine ROS  Renal/GU Renal disease  negative genitourinary   Musculoskeletal   Abdominal   Peds  Hematology negative hematology ROS (+)   Anesthesia Other Findings   Reproductive/Obstetrics negative OB ROS                            Anesthesia Physical Anesthesia Plan  ASA: II  Anesthesia Plan: General   Post-op Pain Management:    Induction: Intravenous  Airway Management Planned: Oral ETT  Additional Equipment:   Intra-op Plan:   Post-operative Plan: Extubation in OR  Informed Consent: I have reviewed the patients History and Physical, chart, labs and discussed the procedure including the risks, benefits and alternatives for the proposed anesthesia with the patient or authorized representative who has indicated his/her understanding and acceptance.   Dental advisory given  Plan Discussed with: CRNA  Anesthesia Plan Comments:         Anesthesia Quick Evaluation

## 2017-02-12 ENCOUNTER — Encounter (HOSPITAL_COMMUNITY): Payer: Self-pay | Admitting: Neurosurgery

## 2017-02-12 NOTE — Care Management Note (Signed)
Case Management Note  Patient Details  Name: Gregory Reid. MRN: [id number] Date of Birth: Feb 10, 1946  Subjective/Objective:     Pt underwent:  LEFT LUMBAR FOUR- LUMBAR FIVE LAMINOTOMY,FORAMINOTOMY,  MICRODISCECTOMY. He is from home with spouse.            Action/Plan: Pt currently on bedrest. Awaiting PT/OT recommendations when patient is ambulatory. CM following for d/c needs, physician orders.  Expected Discharge Date:                  Expected Discharge Plan:     In-House Referral:     Discharge planning Services     Post Acute Care Choice:    Choice offered to:     DME Arranged:    DME Agency:     HH Arranged:    HH Agency:     Status of Service:  In process, will continue to follow  If discussed at Long Length of Stay Meetings, dates discussed:    Additional Comments:  Pollie Friar, RN 02/12/2017, 12:15 PM

## 2017-02-12 NOTE — Progress Notes (Signed)
Patient had little c/o pain, managed with scheduled toradol. Patient educated by staff on flat bed rest and log rolling.  Patient slept well and felt relief of discomfort when changing positions throughout night.  Continue to monitor.

## 2017-02-12 NOTE — Progress Notes (Signed)
Vitals:   02/11/17 2057 02/12/17 0111 02/12/17 0510 02/12/17 0936  BP: 113/63 130/71 127/67 (!) 148/68  Pulse: 92 74 70 77  Resp: 18 18 20 20   Temp: 98.4 F (36.9 C) 98.7 F (37.1 C) 97.9 F (36.6 C) 98.3 F (36.8 C)  TempSrc: Oral Oral Oral Oral  SpO2: 96% 99% 97% 98%  Weight:      Height:        Patient continues on strict bed rest, with the head of bed flat, logrolling side to side. Continued excellent relief of left lumbar radicular pain. Minimal incisional discomfort. Receiving Toradol, but has not needed any Tylenol or hydrocodone. Foley to straight drainage. We'll plan on DC Foley once up and about. We'll plan on mobilizing on 5/26. Eating well. No complaints.  Plan: Doing well following surgery yesterday. Continue bedrest, with head of bed flat, logrolling side to side. Spoke with patient and his wife regarding plans for care during hospitalization and post hospitalization.  Hosie Spangle, MD 02/12/2017, 10:34 AM

## 2017-02-13 NOTE — Progress Notes (Signed)
Vitals:   02/12/17 2102 02/13/17 0056 02/13/17 0518 02/13/17 0848  BP: 131/81 139/76 129/84 124/63  Pulse: 70 79 64 78  Resp: 20 18 18 18   Temp: 98.3 F (36.8 C) 98.5 F (36.9 C) 98.4 F (36.9 C) 97.9 F (36.6 C)  TempSrc: Oral Oral Oral Oral  SpO2: 98% 98% 97% 99%  Weight:      Height:        Patient resting comfortably in bed. Excellent relief of left lumbar radicular pain. Mild incisional discomfort. Has not needed any pain medication (Tylenol, hydrocodone, etc.).  Continues on strict bedrest, with head of bed flat, no reverse Trendelenburg, logrolling side to side, Foley to straight drainage.  Plan: Doing well on strict bedrest with head of bed flat. We'll plan on mobilizing tomorrow.  Hosie Spangle, MD 02/13/2017, 10:37 AM

## 2017-02-13 NOTE — Consult Note (Signed)
[facility] Primary Care Navigator  02/13/2017  Gregory Reid. 1945/09/25 [id number]   Met with patient and wife [name]) at the bedside to identify possible discharge needs. Patient reports having worsening severe pain to left leg that had led to this admission/ surgery. Patient endorses Dr. Jani Gravel with [facility] as his[facility] care provider.   [facility] [employer] [facility] without any problem.   Patient states managing his own medications at home using "pill box" system monthly.  His wife providestransportation to his doctors' appointments.  Patient's wife is his primary caregiverat homeas stated.  Patient is currently on bedrest.  Anticipated discharge plan is home per patient. Still awaiting PT/ OT recommendations and physician order for discharge according to patient.   Patient voiced understanding to call primary care provider's office when he returns back home, for a post discharge follow-up appointment within a week or sooner if needs arise.Patient letter (with PCP's contact number) was provided as areminder. He verbalized plan to call primary care provider's office to reschedule his prior appointment in 3- 4 weeks to be seen earlier for follow-up.  Patient communicated no further health management needs or concerns at this time. [facility] care management contact information provided for future needs that he may have.   For questions, please contact:  Dannielle Huh, BSN, RN- [facility] Primary Care Navigator  Telephone: [phone] [place]

## 2017-02-14 NOTE — Progress Notes (Signed)
Vitals:   02/13/17 2047 02/14/17 0103 02/14/17 0534 02/14/17 0827  BP: 135/75 133/69 129/62 (!) 143/91  Pulse: (!) 58 63 62 74  Resp: 20 20 20 18   Temp: 98.6 F (37 C) 98.2 F (36.8 C) 97.9 F (36.6 C) 98 F (36.7 C)  TempSrc: Oral Oral Oral Oral  SpO2: 98% 99% 93% 97%  Weight:      Height:        Patient resting comfortably. Nursing staff, in consultation with me, has begun mobilization process. Orders written. We'll DC Foley once up and ambulating. Encouraged to ambulate with the staff at least 4 times today.  Plan: Continue to progress through postoperative recovery.  Hosie Spangle, MD 02/14/2017, 9:34 AM

## 2017-02-14 NOTE — Plan of Care (Signed)
Problem: Education: Goal: Knowledge of [facility] Education information/materials will improve Outcome: Progressing POC reviewed with pt.; pt. asking about coming off being flat in bed; per Dr.'s note pt.will be mobilized 5/26.

## 2017-02-14 NOTE — Progress Notes (Signed)
Pt ambulated with rolling walker in the hallway with no noted distress. Gait steady.  Pt stated that he was sore but denies pain medication. Surgical dressing site clean, dry, and intact. Pt sitting up in a chair at this time. Call bell within reach. Will continue to monitor.

## 2017-02-15 MED ORDER — HYDROCODONE-ACETAMINOPHEN 5-325 MG PO TABS: 1.0000 | ORAL_TABLET | ORAL | 0 refills | Status: DC | PRN

## 2017-02-15 MED ORDER — CYCLOBENZAPRINE HCL 10 MG PO TABS: 10.0000 mg | ORAL_TABLET | Freq: Three times a day (TID) | ORAL | Status: DC | PRN

## 2017-02-15 MED ORDER — CYCLOBENZAPRINE HCL 10 MG PO TABS: 10.0000 mg | ORAL_TABLET | Freq: Three times a day (TID) | ORAL | 0 refills | Status: DC | PRN

## 2017-02-15 NOTE — Progress Notes (Signed)
Foley removed this am and pt has voided. Ambulates in the hallway with brace on and aligned. Pt denies pain or discomfort. No noted distress. Will continue to monitor.

## 2017-02-15 NOTE — Progress Notes (Signed)
Vitals:   02/14/17 1818 02/14/17 2100 02/15/17 0137 02/15/17 0438  BP: 132/76 130/62 115/71 122/78  Pulse: 75 (!) 52 (!) 53 64  Resp: 16 17 17 18   Temp: 97.6 F (36.4 C) 98.1 F (36.7 C) 98.2 F (36.8 C) 97.7 F (36.5 C)  TempSrc: Oral Oral Oral Oral  SpO2: 97% 97% 98% 96%  Weight:      Height:        Patient resting comfortably in bed this morning. Made limited to moderate progress yesterday. Ambulated once with the staff. Foley still to straight drainage.  Assisted patient to get up to a standing position, and fitted him for a lumbar brace. Instructed him in donning and doffing. Walk with the patient and his wife down 2 hall lengths and back.  Plan: Spoke with patient's nurse about continuing progression this morning. Including discontinuing his Foley catheter monitoring voiding function, as well as further ambulation.  Hosie Spangle, MD 02/15/2017, 8:58 AM

## 2017-02-15 NOTE — Progress Notes (Signed)
Pt discharging at this time with wife home taking all personal belongings. Surgical dressing clean, dry and intact. Brace on and aligned. Pt denies pain or discomfort. IV discontinued, dry dressing applied. Discharge instructions along with prescriptions provided with verbal understanding. Pt has a follow up appt scheduled with MD.

## 2017-02-15 NOTE — Discharge Summary (Signed)
Physician Discharge Summary  Patient ID: Gregory Reid. MRN: [id number] DOB/AGE: 1946/09/08 71 y.o.  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 Postoperatively he was kept on bed rest for 3 days, and then mobilized. Once up and about his Foley catheter was removed, and he has been voiding well. His dressing was removed, and his wound left open to air. It is healing nicely. There is no erythema, swelling, or drainage. He and his wife were given instructions regarding wound care and activities following discharge. He is scheduled to follow-up with me in about 2 and half weeks.  Discharge Exam: Blood pressure 137/69, pulse 75, temperature 98.4 F (36.9 C), temperature source Oral, resp. rate 18, height 5\' 9"  (1.753 m), weight 100.7 kg (221 lb 14.4 oz), SpO2 98 %.  Disposition:  Home  Discharge Instructions    Discharge wound care:    Complete by:  As directed    Leave the wound open to air. Shower daily with the wound uncovered. Water and soapy water should run over the incision area. Do not wash directly on the incision for 2 weeks. Remove the glue after 2 weeks.   Driving Restrictions    Complete by:  As directed    No driving for 2 weeks. May ride in the car locally now. May begin to drive locally in 2 weeks.   Other Restrictions    Complete by:  As directed    Walk  gradually increasing distances out in the fresh air at least twice a day. Walking additional 6 times inside the house, gradually increasing distances, daily. No bending, lifting, or twisting. Perform activities between shoulder and waist height (that is at counter height when standing or table height when sitting).     Allergies as of 02/15/2017      Reactions   No Known Allergies       Medication List    STOP taking these medications   fluticasone 50 MCG/ACT nasal spray Commonly known as:  FLONASE   ranitidine 150 MG tablet Commonly known as:  ZANTAC     TAKE these medications   acetaminophen 500 MG tablet Commonly known as:  TYLENOL Take 1,000 mg by mouth 2 (two) times daily as needed for moderate pain.   allopurinol 300 MG tablet Commonly known as:  ZYLOPRIM Take 300 mg by mouth every evening.   amLODipine 5 MG tablet Commonly known as:  NORVASC Take 5 mg by mouth every evening.   aspirin 81 MG tablet Take 81 mg by mouth every evening.   atorvastatin 20 MG tablet Commonly known as:  LIPITOR Take 10 mg by mouth daily at 6 PM.   cholecalciferol 1000 units tablet Commonly known as:  VITAMIN D Take 1,000 Units by mouth every evening.   cyclobenzaprine 10 MG tablet Commonly known as:  FLEXERIL Take 1 tablet (10 mg total) by mouth 3 (three) times daily as needed for muscle spasms.   folic acid 759 MCG tablet Commonly known as:  FOLVITE Take 800 mcg by mouth every evening.   HYDROcodone-acetaminophen 5-325 MG tablet Commonly known as:  NORCO/VICODIN Take 1-2 tablets by mouth every 4 (four) hours as needed for moderate pain.   nebivolol 5 MG tablet Commonly known as:  BYSTOLIC Take 2.5 mg by mouth every evening.   UNABLE TO FIND Take 4 capsules by mouth every evening. Strength Trial  5 year study patient is 2 years into trial  [facility] Dr Glennon Hamilton Concentrated Fish Oil or placebo   vitamin B-12 1000 MCG tablet Commonly known as:   CYANOCOBALAMIN Take 1,000 mcg by mouth every evening.        SignedHosie Spangle 02/15/2017, 12:58 PM

## 2017-03-03 DIAGNOSIS — Z9889 Other specified postprocedural states: Secondary | ICD-10-CM | POA: Insufficient documentation

## 2017-03-03 DIAGNOSIS — I10 Essential (primary) hypertension: Secondary | ICD-10-CM | POA: Diagnosis not present

## 2017-03-03 DIAGNOSIS — Z125 Encounter for screening for malignant neoplasm of prostate: Secondary | ICD-10-CM | POA: Diagnosis not present

## 2017-03-03 DIAGNOSIS — R200 Anesthesia of skin: Secondary | ICD-10-CM | POA: Diagnosis not present

## 2017-03-10 DIAGNOSIS — I10 Essential (primary) hypertension: Secondary | ICD-10-CM | POA: Diagnosis not present

## 2017-03-10 DIAGNOSIS — M5432 Sciatica, left side: Secondary | ICD-10-CM | POA: Diagnosis not present

## 2017-03-10 DIAGNOSIS — R972 Elevated prostate specific antigen [PSA]: Secondary | ICD-10-CM | POA: Diagnosis not present

## 2017-05-01 DIAGNOSIS — Z9889 Other specified postprocedural states: Secondary | ICD-10-CM | POA: Diagnosis not present

## 2017-05-22 DIAGNOSIS — R972 Elevated prostate specific antigen [PSA]: Secondary | ICD-10-CM | POA: Diagnosis not present

## 2017-05-26 ENCOUNTER — Other Ambulatory Visit: Payer: Self-pay

## 2017-05-26 NOTE — Patient Outreach (Signed)
[facility]) Care Management  05/26/2017  Gregory Reid. [birth year] [id number]   Medication Adherence call to Gregory Reid  The reason for this call is because Mr. Deal is showing past due under [place]. On his Atorvastatin 20 mg spoke to patient he said doctor Jani Gravel told him to take 1/2 tablet (10 mg) instead of 1 tablet of the 20 mg.patient said he still has some medication he does not need any at this time.   [facility] Direct Dial [phone]  Fax [phone] [email or web address]

## 2017-06-30 DIAGNOSIS — H52203 Unspecified astigmatism, bilateral: Secondary | ICD-10-CM | POA: Diagnosis not present

## 2017-06-30 DIAGNOSIS — H353131 Nonexudative age-related macular degeneration, bilateral, early dry stage: Secondary | ICD-10-CM | POA: Diagnosis not present

## 2017-06-30 DIAGNOSIS — H2513 Age-related nuclear cataract, bilateral: Secondary | ICD-10-CM | POA: Diagnosis not present

## 2017-07-14 DIAGNOSIS — N200 Calculus of kidney: Secondary | ICD-10-CM | POA: Diagnosis not present

## 2017-07-14 DIAGNOSIS — N201 Calculus of ureter: Secondary | ICD-10-CM | POA: Diagnosis not present

## 2017-07-14 DIAGNOSIS — R311 Benign essential microscopic hematuria: Secondary | ICD-10-CM | POA: Diagnosis not present

## 2017-07-16 DIAGNOSIS — Z23 Encounter for immunization: Secondary | ICD-10-CM | POA: Diagnosis not present

## 2017-07-21 DIAGNOSIS — N201 Calculus of ureter: Secondary | ICD-10-CM | POA: Diagnosis not present

## 2017-07-21 DIAGNOSIS — N281 Cyst of kidney, acquired: Secondary | ICD-10-CM | POA: Diagnosis not present

## 2017-07-23 ENCOUNTER — Other Ambulatory Visit: Payer: Self-pay | Admitting: Urology

## 2017-07-23 DIAGNOSIS — N201 Calculus of ureter: Secondary | ICD-10-CM | POA: Diagnosis not present

## 2017-07-24 ENCOUNTER — Encounter (HOSPITAL_COMMUNITY): Payer: Self-pay | Admitting: *Deleted

## 2017-07-24 NOTE — H&P (Signed)
Office Visit Report     07/23/2017   --------------------------------------------------------------------------------   Gregory Reid  MRN: [id number]  PRIMARY CARE:  [name]), MD  DOB: August 14, 1946, 71 year old Male  REFERRING:  [name] MD  SSN: ----**[id number]  PROVIDER:  Franchot Gallo, M.D.    TREATING:  Gregory Reid    LOCATION:  [facility] [phone]   --------------------------------------------------------------------------------   CC: I have ureteral stone.  HPI: Gregory Reid is a 71 year-old male established patient who is here for ureteral stone.  Today states he feels much better now that constipation has resolved.    The problem is on the left side. He is not currently having flank pain, back pain, groin pain, nausea, vomiting, fever or chills. Pain is occuring on the left side. He has not caught a stone in his urine strainer since his symptoms began.     ALLERGIES: None   MEDICATIONS: Tamsulosin Hcl 0.4 mg capsule, ext release 24 hr 1 capsule PO Daily  Allopurinol TABS Oral  Amlodipine Besylate 5 mg tablet  Aspirin 81 MG TABS Oral  Atorvastatin Calcium 10 mg tablet  Bystolic 2.5 mg tablet  Fish Oil  Folic Acid  Hydrocodone-Acetaminophen 5 mg-325 mg tablet 1 tablet PO Q 6 H  Vitamin B12  Vitamin D TABS Oral     GU PSH: No GU PSH    NON-GU PSH: Back Surgery (Unspecified) - 02/11/2017 Shoulder Surgery (Unspecified)    GU PMH: Renal cyst (Stable, Chronic), Left, No blood flow noted. F/U 1 yr w/RUS - 07/21/2017 Ureteral calculus (Worsening, Chronic), Left, Ketorolac 30 mg IM today. Instructed to resolve constipation with Mag Citrate 1/2 bottle now and if no BM in 4 hrs repeat. May use daily Miralax and RTC in 2 days for KUB. If stone clearly seen will proceed with elective ESWL. If stone not seen may need cystourethroscopy, (L) RPG, stone extraction, and possible double J stent placement.  May use PRN NSAID along with Hydrocodone. - 07/21/2017, Distal Ureteral Stone On The Right, - 2014 Abdominal Pain Unspec (Acute), Left, Secondary to ureteral stone - 07/14/2017 Dysuria (Acute), Secondary to ureteral stone - 07/14/2017 Microscopic hematuria (Acute), Secondary to ureteral stone - 07/14/2017 ED due to arterial insufficiency, EGD, he doesn't desire therapy - 05/22/2017 Elevated PSA, Elevating trend PSA, from 3.7-5.8 in the period of about a year. Greater than 20% free fraction, however. Benign/slightly enlarged prostate on exam today. - 05/22/2017 Renal calculus, Kidney stone on right side - 2014      PMH Notes:  1898-09-22 00:00:00 - Note: Normal Routine History And Physical Adult  2010-10-10 15:27:59 - Note: Urinary Calculus   NON-GU PMH: Personal history of other endocrine, nutritional and metabolic disease, History of hypercholesterolemia - 2014 Gout Hypercholesterolemia Hypertension    FAMILY HISTORY: Death In The Family Father - Father Death In The Family Mother - Mother Heart Disease - Mother Lung Cancer - Father   SOCIAL HISTORY: Marital Status: Married Ethnicity: Not Hispanic Or Latino; Race: White Current Smoking Status: Patient has never smoked.   Tobacco Use Assessment Completed: Used Tobacco in last 30 days? Does not use smokeless tobacco. Social Drinker.  Does not use drugs. Drinks 2 caffeinated drinks per day. Patient's occupation is/was Retired.    REVIEW OF SYSTEMS:    GU Review Male:   Patient denies frequent urination, hard to postpone urination, burning/ pain with urination, get up at night to urinate, leakage of urine, stream  starts and stops, trouble starting your stream, have to strain to urinate , erection problems, and penile pain.  Gastrointestinal (Upper):   Patient denies nausea, vomiting, and indigestion/ heartburn.  Gastrointestinal (Lower):   Patient denies diarrhea and constipation.  Constitutional:   Patient denies fever, night  sweats, weight loss, and fatigue.  Skin:   Patient denies skin rash/ lesion and itching.  Eyes:   Patient denies blurred vision and double vision.  Ears/ Nose/ Throat:   Patient denies sore throat and sinus problems.  Hematologic/Lymphatic:   Patient denies swollen glands and easy bruising.  Cardiovascular:   Patient denies leg swelling and chest pains.  Respiratory:   Patient denies cough and shortness of breath.  Endocrine:   Patient denies excessive thirst.  Musculoskeletal:   Patient denies back pain and joint pain.  Neurological:   Patient denies headaches and dizziness.  Psychologic:   Patient denies depression and anxiety.   VITAL SIGNS:      07/23/2017 08:09 AM  Weight 210 lb / 95.25 kg  Height 70 in / 177.8 cm  BP 185/70 mmHg  Pulse 65 /min  Temperature 98.0 F / 36.6 C  BMI 30.1 kg/m   MULTI-SYSTEM PHYSICAL EXAMINATION:    Constitutional: Well-nourished. No physical deformities. Normally developed. Good grooming.   Respiratory: Normal breath sounds. No labored breathing, no use of accessory muscles.   Cardiovascular: Regular rate and rhythm. No murmur, no gallop. Normal temperature, normal extremity pulses, no swelling, no varicosities.   Skin: No paleness, no jaundice, no cyanosis. No lesion, no ulcer, no rash.   Neurologic / Psychiatric: Oriented to time, oriented to place, oriented to person. No depression, no anxiety, no agitation.   Gastrointestinal: No mass, no tenderness, no rigidity, non obese abdomen. (L) CVAT  Musculoskeletal: Normal gait and station of head and neck.     PAST DATA REVIEWED:  Source Of History:  Patient   05/22/17 03/03/17 08/13/16 02/05/16  PSA  Total PSA 5.33 ng/mL 5.8 ng/dl 4.1 ng/dl 3.7 ng/dl  % Free PSA  21.2 %      PROCEDURES:         KUB - 74018  A single view of the abdomen is obtained.      Can clearly see left mid ureteral stone at L3.          Urinalysis - 81003 Dipstick Dipstick Cont'd  Specimen: Voided Bilirubin: Neg   Color: Yellow Ketones: Neg  Appearance: Clear Blood: Neg  Specific Gravity: 1.020 Protein: Neg  pH: 6.5 Urobilinogen: 0.2  Glucose: Neg Nitrites: Neg    Leukocyte Esterase: Neg    ASSESSMENT:      ICD-10 Details  1 GU:   Ureteral calculus - N20.1 Left, Stable, Chronic - Can clearly see left mid ureteral stone at L3. Recommend he proceed with elective ESWL.    PLAN:           Schedule Return Visit/Planned Activity: Keep Scheduled Appointment          Document Letter(s):  Created for Patient: Clinical Summary         Notes:   I went over the procedure of extracorporal shockwave lithotripsy with the patient in quite detail. He understands the risk of poor fragmentation resulting in further ureteral intervention. He also understands that possibility of needing additional procedures to clear stones. Finally, we discussed the risks of hematoma. The patient is willing to proceed.   For ureteroscopy I described the risks which include heart attack, stroke, pulmonary  embolus, death, bleeding, infection, damage to contiguous structures, positioning injury, ureteral stricture, ureteral avulsion, ureteral injury, need for ureteral stent, inability to perform ureteroscopy, need for an interval procedure, inability to clear stone burden, stent discomfort and pain.   For shockwave lithotripsy I described the risks which include arrhythmia, kidney contusion, kidney hemorrhage, need for transfusion, back discomfort, flank ecchymosis, flank abrasion, inability to break up stone, inability to pass stone fragments, Steinstrasse, infection associated with obstructing stones, need for different surgical procedure and possible need for repeat shockwave lithotripsy. The patient is willing to proceed.      * Signed by Gregory Reid on 07/23/17 at 8:21 AM (EDT)*     The information contained in this medical record document is considered private and confidential patient information. This information can only  be used for the medical diagnosis and/or medical services that are being provided by the patient's selected caregivers. This information can only be distributed outside of the patient's care if the patient agrees and signs waivers of authorization for this information to be sent to an outside source or route.  Add: I reviewed the patient's chart, labs and CT and KUB images. Agree with NP Warden's assessment and plan.

## 2017-07-27 ENCOUNTER — Ambulatory Visit (HOSPITAL_COMMUNITY)
Admission: RE | Admit: 2017-07-27 | Discharge: 2017-07-27 | Disposition: A | Discharge status: Home / Self Care | Payer: Medicare Other | Source: Ambulatory Visit | Attending: Urology | Admitting: Urology

## 2017-07-27 ENCOUNTER — Encounter (HOSPITAL_COMMUNITY)
Admission: RE | Disposition: A | Discharge status: Home / Self Care | Payer: Self-pay | Source: Ambulatory Visit | Attending: Urology

## 2017-07-27 ENCOUNTER — Encounter (HOSPITAL_COMMUNITY): Payer: Self-pay | Admitting: General Practice

## 2017-07-27 ENCOUNTER — Ambulatory Visit (HOSPITAL_COMMUNITY): Payer: Medicare Other

## 2017-07-27 DIAGNOSIS — Z9889 Other specified postprocedural states: Secondary | ICD-10-CM | POA: Insufficient documentation

## 2017-07-27 DIAGNOSIS — Z79899 Other long term (current) drug therapy: Secondary | ICD-10-CM | POA: Insufficient documentation

## 2017-07-27 DIAGNOSIS — I10 Essential (primary) hypertension: Secondary | ICD-10-CM | POA: Insufficient documentation

## 2017-07-27 DIAGNOSIS — N202 Calculus of kidney with calculus of ureter: Secondary | ICD-10-CM | POA: Insufficient documentation

## 2017-07-27 DIAGNOSIS — Z7982 Long term (current) use of aspirin: Secondary | ICD-10-CM | POA: Diagnosis not present

## 2017-07-27 DIAGNOSIS — Z801 Family history of malignant neoplasm of trachea, bronchus and lung: Secondary | ICD-10-CM | POA: Insufficient documentation

## 2017-07-27 DIAGNOSIS — M109 Gout, unspecified: Secondary | ICD-10-CM | POA: Diagnosis not present

## 2017-07-27 DIAGNOSIS — N201 Calculus of ureter: Secondary | ICD-10-CM | POA: Diagnosis not present

## 2017-07-27 DIAGNOSIS — N400 Enlarged prostate without lower urinary tract symptoms: Secondary | ICD-10-CM | POA: Diagnosis not present

## 2017-07-27 DIAGNOSIS — E7800 Pure hypercholesterolemia, unspecified: Secondary | ICD-10-CM | POA: Diagnosis not present

## 2017-07-27 DIAGNOSIS — Z8249 Family history of ischemic heart disease and other diseases of the circulatory system: Secondary | ICD-10-CM | POA: Insufficient documentation

## 2017-07-27 HISTORY — PX: EXTRACORPOREAL SHOCK WAVE LITHOTRIPSY: SHX1557

## 2017-07-27 SURGERY — LITHOTRIPSY, ESWL
Anesthesia: LOCAL | Laterality: Left

## 2017-07-27 MED ORDER — DIAZEPAM 5 MG PO TABS
10.0000 mg | ORAL_TABLET | ORAL | Status: AC
  Administered 2017-07-27: 10 mg via ORAL
  Filled 2017-07-27: qty 2

## 2017-07-27 MED ORDER — ASPIRIN 81 MG PO TABS: 81.0000 mg | ORAL_TABLET | Freq: Every evening | ORAL | Status: DC

## 2017-07-27 MED ORDER — DIPHENHYDRAMINE HCL 25 MG PO CAPS
25.0000 mg | ORAL_CAPSULE | ORAL | Status: AC
  Administered 2017-07-27: 25 mg via ORAL
  Filled 2017-07-27: qty 1

## 2017-07-27 MED ORDER — SODIUM CHLORIDE 0.9 % IV SOLN
INTRAVENOUS | Status: DC
  Administered 2017-07-27: 08:00:00 via INTRAVENOUS

## 2017-07-27 MED ORDER — CIPROFLOXACIN HCL 500 MG PO TABS
500.0000 mg | ORAL_TABLET | ORAL | Status: AC
  Administered 2017-07-27: 500 mg via ORAL
  Filled 2017-07-27: qty 1

## 2017-07-27 NOTE — Discharge Instructions (Signed)
Lithotripsy, Care After °This sheet gives you information about how to care for yourself after your procedure. Your health care provider may also give you more specific instructions. If you have problems or questions, contact your health care provider. °What can I expect after the procedure? °After the procedure, it is common to have: °· Some blood in your urine. This should only last for a few days. °· Soreness in your back, sides, or upper abdomen for a few days. °· Blotches or bruises on your back where the pressure wave entered the skin. °· Pain, discomfort, or nausea when pieces (fragments) of the kidney stone move through the tube that carries urine from the kidney to the bladder (ureter). Stone fragments may pass soon after the procedure, but they may continue to pass for up to 4-8 weeks. °? If you have severe pain or nausea, contact your health care provider. This may be caused by a large stone that was not broken up, and this may mean that you need more treatment. °· Some pain or discomfort during urination. °· Some pain or discomfort in the lower abdomen or (in men) at the base of the penis. ° °Follow these instructions at home: °Medicines °· Take over-the-counter and prescription medicines only as told by your health care provider. °· If you were prescribed an antibiotic medicine, take it as told by your health care provider. Do not stop taking the antibiotic even if you start to feel better. °· Do not drive for 24 hours if you were given a medicine to help you relax (sedative). °· Do not drive or use heavy machinery while taking prescription pain medicine. °Eating and drinking °· Drink enough water and fluids to keep your urine clear or pale yellow. This helps any remaining pieces of the stone to pass. It can also help prevent new stones from forming. °· Eat plenty of fresh fruits and vegetables. °· Follow instructions from your health care provider about eating and drinking restrictions. You may be  instructed: °? To reduce how much salt (sodium) you eat or drink. Check ingredients and nutrition facts on packaged foods and beverages. °? To reduce how much meat you eat. °· Eat the recommended amount of calcium for your age and gender. Ask your health care provider how much calcium you should have. °General instructions °· Get plenty of rest. °· Most people can resume normal activities 1-2 days after the procedure. Ask your health care provider what activities are safe for you. °· If directed, strain all urine through the strainer that was provided by your health care provider. °? Keep all fragments for your health care provider to see. Any stones that are found may be sent to a medical lab for examination. The stone may be as small as a grain of salt. °· Keep all follow-up visits as told by your health care provider. This is important. °Contact a health care provider if: °· You have pain that is severe or does not get better with medicine. °· You have nausea that is severe or does not go away. °· You have blood in your urine longer than your health care provider told you to expect. °· You have more blood in your urine. °· You have pain during urination that does not go away. °· You urinate more frequently than usual and this does not go away. °· You develop a rash or any other possible signs of an allergic reaction. °Get help right away if: °· You have severe pain in   your back, sides, or upper abdomen. °· You have severe pain while urinating. °· Your urine is very dark red. °· You have blood in your stool (feces). °· You cannot pass any urine at all. °· You feel a strong urge to urinate after emptying your bladder. °· You have a fever or chills. °· You develop shortness of breath, difficulty breathing, or chest pain. °· You have severe nausea that leads to persistent vomiting. °· You faint. °Summary °· After this procedure, it is common to have some pain, discomfort, or nausea when pieces (fragments) of the  kidney stone move through the tube that carries urine from the kidney to the bladder (ureter). If this pain or nausea is severe, however, you should contact your health care provider. °· Most people can resume normal activities 1-2 days after the procedure. Ask your health care provider what activities are safe for you. °· Drink enough water and fluids to keep your urine clear or pale yellow. This helps any remaining pieces of the stone to pass, and it can help prevent new stones from forming. °· If directed, strain your urine and keep all fragments for your health care provider to see. Fragments or stones may be as small as a grain of salt. °· Get help right away if you have severe pain in your back, sides, or upper abdomen or have severe pain while urinating. °This information is not intended to replace advice given to you by your health care provider. Make sure you discuss any questions you have with your health care provider. °Document Released: 09/28/2007 Document Revised: 07/30/2016 Document Reviewed: 07/30/2016 °Elsevier Interactive Patient Education © 2017 [employer] ° °

## 2017-07-27 NOTE — Progress Notes (Signed)
Discussed with wife pt was snoring and had brief apnea episodes during eswl. She has noted loud snoring at home and she will discuss it with Dr. Maudie Mercury when they f/u with him.

## 2017-07-27 NOTE — Op Note (Signed)
Left ESWL   7 mm Left proximal stone   Findings: Pt tolerated well. Stone fragmented well.

## 2017-07-27 NOTE — Interval H&P Note (Signed)
History and Physical Interval Note:  07/27/2017 10:01 AM  Gregory Reid.  has presented today for surgery, with the diagnosis of LEFT URETERAL STONE  The various methods of treatment have been discussed with the patient and family. After consideration of risks, benefits and other options for treatment, the patient has consented to  Procedure(s): LEFT EXTRACORPOREAL SHOCK WAVE LITHOTRIPSY (ESWL) (Left) as a surgical intervention .  The patient's history has been reviewed, patient examined, no change in status, stable for surgery. He is well today. No fever. Stone stable in left proximal ureter on KUB.   I have reviewed the patient's chart and labs.  Questions were answered to the patient's satisfaction.     Gregory Reid

## 2017-08-12 DIAGNOSIS — N201 Calculus of ureter: Secondary | ICD-10-CM | POA: Diagnosis not present

## 2017-09-02 DIAGNOSIS — R972 Elevated prostate specific antigen [PSA]: Secondary | ICD-10-CM | POA: Diagnosis not present

## 2017-09-02 DIAGNOSIS — I10 Essential (primary) hypertension: Secondary | ICD-10-CM | POA: Diagnosis not present

## 2017-09-02 DIAGNOSIS — Z23 Encounter for immunization: Secondary | ICD-10-CM | POA: Diagnosis not present

## 2017-09-02 DIAGNOSIS — Z0000 Encounter for general adult medical examination without abnormal findings: Secondary | ICD-10-CM | POA: Diagnosis not present

## 2017-09-09 DIAGNOSIS — Z0000 Encounter for general adult medical examination without abnormal findings: Secondary | ICD-10-CM | POA: Diagnosis not present

## 2017-09-09 DIAGNOSIS — H9190 Unspecified hearing loss, unspecified ear: Secondary | ICD-10-CM | POA: Diagnosis not present

## 2017-09-09 DIAGNOSIS — R972 Elevated prostate specific antigen [PSA]: Secondary | ICD-10-CM | POA: Diagnosis not present

## 2017-09-09 DIAGNOSIS — I10 Essential (primary) hypertension: Secondary | ICD-10-CM | POA: Diagnosis not present

## 2017-09-09 DIAGNOSIS — E7800 Pure hypercholesterolemia, unspecified: Secondary | ICD-10-CM | POA: Diagnosis not present

## 2017-09-25 DIAGNOSIS — H903 Sensorineural hearing loss, bilateral: Secondary | ICD-10-CM | POA: Diagnosis not present

## 2017-09-25 DIAGNOSIS — H9313 Tinnitus, bilateral: Secondary | ICD-10-CM | POA: Diagnosis not present

## 2017-11-30 DIAGNOSIS — R972 Elevated prostate specific antigen [PSA]: Secondary | ICD-10-CM | POA: Diagnosis not present

## 2017-11-30 DIAGNOSIS — N201 Calculus of ureter: Secondary | ICD-10-CM | POA: Diagnosis not present

## 2017-11-30 DIAGNOSIS — D3002 Benign neoplasm of left kidney: Secondary | ICD-10-CM | POA: Diagnosis not present

## 2017-12-08 DIAGNOSIS — H43812 Vitreous degeneration, left eye: Secondary | ICD-10-CM | POA: Diagnosis not present

## 2017-12-08 DIAGNOSIS — H353131 Nonexudative age-related macular degeneration, bilateral, early dry stage: Secondary | ICD-10-CM | POA: Diagnosis not present

## 2017-12-09 DIAGNOSIS — N2889 Other specified disorders of kidney and ureter: Secondary | ICD-10-CM | POA: Diagnosis not present

## 2017-12-09 DIAGNOSIS — D3002 Benign neoplasm of left kidney: Secondary | ICD-10-CM | POA: Diagnosis not present

## 2017-12-31 DIAGNOSIS — H43392 Other vitreous opacities, left eye: Secondary | ICD-10-CM | POA: Diagnosis not present

## 2017-12-31 DIAGNOSIS — H353131 Nonexudative age-related macular degeneration, bilateral, early dry stage: Secondary | ICD-10-CM | POA: Diagnosis not present

## 2018-01-14 DIAGNOSIS — R5383 Other fatigue: Secondary | ICD-10-CM | POA: Diagnosis not present

## 2018-01-14 DIAGNOSIS — R6889 Other general symptoms and signs: Secondary | ICD-10-CM | POA: Diagnosis not present

## 2018-01-14 DIAGNOSIS — I10 Essential (primary) hypertension: Secondary | ICD-10-CM | POA: Diagnosis not present

## 2018-01-14 DIAGNOSIS — R197 Diarrhea, unspecified: Secondary | ICD-10-CM | POA: Diagnosis not present

## 2018-01-14 DIAGNOSIS — E7800 Pure hypercholesterolemia, unspecified: Secondary | ICD-10-CM | POA: Diagnosis not present

## 2018-01-20 DIAGNOSIS — R972 Elevated prostate specific antigen [PSA]: Secondary | ICD-10-CM | POA: Diagnosis not present

## 2018-02-03 DIAGNOSIS — C61 Malignant neoplasm of prostate: Secondary | ICD-10-CM | POA: Diagnosis not present

## 2018-03-03 DIAGNOSIS — H353131 Nonexudative age-related macular degeneration, bilateral, early dry stage: Secondary | ICD-10-CM | POA: Diagnosis not present

## 2018-03-03 DIAGNOSIS — H43392 Other vitreous opacities, left eye: Secondary | ICD-10-CM | POA: Diagnosis not present

## 2018-05-27 ENCOUNTER — Other Ambulatory Visit: Payer: Self-pay | Admitting: Urology

## 2018-05-27 DIAGNOSIS — C61 Malignant neoplasm of prostate: Secondary | ICD-10-CM

## 2018-07-02 DIAGNOSIS — Z23 Encounter for immunization: Secondary | ICD-10-CM | POA: Diagnosis not present

## 2018-07-02 DIAGNOSIS — H52203 Unspecified astigmatism, bilateral: Secondary | ICD-10-CM | POA: Diagnosis not present

## 2018-07-02 DIAGNOSIS — H2513 Age-related nuclear cataract, bilateral: Secondary | ICD-10-CM | POA: Diagnosis not present

## 2018-07-02 DIAGNOSIS — H353131 Nonexudative age-related macular degeneration, bilateral, early dry stage: Secondary | ICD-10-CM | POA: Diagnosis not present

## 2018-07-27 DIAGNOSIS — H209 Unspecified iridocyclitis: Secondary | ICD-10-CM | POA: Diagnosis not present

## 2018-07-27 DIAGNOSIS — K122 Cellulitis and abscess of mouth: Secondary | ICD-10-CM | POA: Diagnosis not present

## 2018-08-03 DIAGNOSIS — K122 Cellulitis and abscess of mouth: Secondary | ICD-10-CM | POA: Diagnosis not present

## 2018-09-01 ENCOUNTER — Other Ambulatory Visit: Payer: Medicare Other

## 2018-09-01 DIAGNOSIS — R5383 Other fatigue: Secondary | ICD-10-CM | POA: Diagnosis not present

## 2018-09-01 DIAGNOSIS — I10 Essential (primary) hypertension: Secondary | ICD-10-CM | POA: Diagnosis not present

## 2018-09-01 DIAGNOSIS — M109 Gout, unspecified: Secondary | ICD-10-CM | POA: Diagnosis not present

## 2018-09-02 ENCOUNTER — Ambulatory Visit
Admission: RE | Admit: 2018-09-02 | Discharge: 2018-09-02 | Disposition: A | Discharge status: Home / Self Care | Payer: Medicare Other | Source: Ambulatory Visit | Attending: Urology | Admitting: Urology

## 2018-09-02 DIAGNOSIS — C61 Malignant neoplasm of prostate: Secondary | ICD-10-CM

## 2018-09-02 MED ORDER — GADOBENATE DIMEGLUMINE 529 MG/ML IV SOLN
17.0000 mL | Freq: Once | INTRAVENOUS | Status: AC | PRN
  Administered 2018-09-02: 17 mL via INTRAVENOUS

## 2018-09-05 ENCOUNTER — Other Ambulatory Visit: Payer: Medicare Other

## 2018-09-07 ENCOUNTER — Other Ambulatory Visit: Payer: Medicare Other

## 2018-09-10 DIAGNOSIS — D1801 Hemangioma of skin and subcutaneous tissue: Secondary | ICD-10-CM | POA: Diagnosis not present

## 2018-09-10 DIAGNOSIS — D225 Melanocytic nevi of trunk: Secondary | ICD-10-CM | POA: Diagnosis not present

## 2018-09-10 DIAGNOSIS — L739 Follicular disorder, unspecified: Secondary | ICD-10-CM | POA: Diagnosis not present

## 2018-09-10 DIAGNOSIS — D2339 Other benign neoplasm of skin of other parts of face: Secondary | ICD-10-CM | POA: Diagnosis not present

## 2018-09-10 DIAGNOSIS — M109 Gout, unspecified: Secondary | ICD-10-CM | POA: Diagnosis not present

## 2018-09-10 DIAGNOSIS — L570 Actinic keratosis: Secondary | ICD-10-CM | POA: Diagnosis not present

## 2018-09-10 DIAGNOSIS — L821 Other seborrheic keratosis: Secondary | ICD-10-CM | POA: Diagnosis not present

## 2018-09-10 DIAGNOSIS — Z0000 Encounter for general adult medical examination without abnormal findings: Secondary | ICD-10-CM | POA: Diagnosis not present

## 2018-09-10 DIAGNOSIS — I788 Other diseases of capillaries: Secondary | ICD-10-CM | POA: Diagnosis not present

## 2018-09-10 DIAGNOSIS — L218 Other seborrheic dermatitis: Secondary | ICD-10-CM | POA: Diagnosis not present

## 2018-10-01 DIAGNOSIS — H903 Sensorineural hearing loss, bilateral: Secondary | ICD-10-CM | POA: Diagnosis not present

## 2018-10-01 DIAGNOSIS — H9313 Tinnitus, bilateral: Secondary | ICD-10-CM | POA: Diagnosis not present

## 2018-10-11 ENCOUNTER — Encounter: Payer: Self-pay | Admitting: *Deleted

## 2018-10-11 ENCOUNTER — Encounter: Payer: Self-pay | Admitting: Radiation Oncology

## 2018-10-11 NOTE — Progress Notes (Addendum)
GU Location of Tumor / Histology: prostatic adenocarcinoma  If Prostate Cancer, Gleason Score is (3 + 4) and PSA is (6.21) on 09/08/2018. Prostate volume: 41.29 grams.  Dellis Filbert. was diagnosed 01/20/2018 with prostate ca. He opted for active surveillance. On 09/02/18 an MRI of the prostate was performed then a fusion biopsy which revealed progression.   Biopsies of prostate (if applicable) revealed:    Past/Anticipated interventions by urology, if any: biopsy, MRI, fusion biopsy, referral for consideration of radiotherapy  Past/Anticipated interventions by medical oncology, if any: no  Weight changes, if any: no  Bowel/Bladder complaints, if any: IPSS 1 with nocturia.  Denies dysuria or hematuria. Denies urinary leakage or incontinence. SHIM 6.  Nausea/Vomiting, if any: no  Pain issues, if any:  No  SAFETY ISSUES:  Prior radiation? no  Pacemaker/ICD? no  Possible current pregnancy? no, male patient  Is the patient on methotrexate? no  Current Complaints / other details:  73 year old male. Married. Retired. Father with hx of lung ca. Maternal grandfather with hx of prostate ca. Sister with hx of breast ca.

## 2018-10-12 ENCOUNTER — Encounter: Payer: Self-pay | Admitting: Medical Oncology

## 2018-10-12 ENCOUNTER — Other Ambulatory Visit: Payer: Self-pay

## 2018-10-12 ENCOUNTER — Ambulatory Visit
Admission: RE | Admit: 2018-10-12 | Discharge: 2018-10-12 | Disposition: A | Discharge status: Home / Self Care | Payer: Medicare Other | Source: Ambulatory Visit | Attending: Radiation Oncology | Admitting: Radiation Oncology

## 2018-10-12 ENCOUNTER — Encounter: Payer: Self-pay | Admitting: Radiation Oncology

## 2018-10-12 VITALS — BP 151/75 | HR 85 | Temp 97.7°F | Resp 20 | Ht 70.0 in | Wt 224.2 lb

## 2018-10-12 DIAGNOSIS — C61 Malignant neoplasm of prostate: Secondary | ICD-10-CM

## 2018-10-12 DIAGNOSIS — Z510 Encounter for antineoplastic radiation therapy: Secondary | ICD-10-CM | POA: Insufficient documentation

## 2018-10-12 HISTORY — DX: Malignant neoplasm of prostate: C61

## 2018-10-12 NOTE — Progress Notes (Signed)
Introduced myself to Gregory Reid and his wife as the prostate nurse navigator and my role. He was diagnosed with prostate cancer in early 2019 but opted for active surveillance. He had repeat biopsy in January and now ready to discuss treatment options. He is not interested in surgery but leaning toward brachytherapy. After his consult with Dr. Tammi Klippel he will make his decision. I gave them my business card and asked them to call me with questions or concerns.

## 2018-10-12 NOTE — Progress Notes (Signed)
Radiation Oncology         [phone]) [phone] ________________________________  Initial outpatient Consultation  Name: Gregory Reid. MRN: [id number]  Date: 10/12/2018  DOB: 1945/10/05  CC:Kim, Jeneen Rinks, MD  Franchot Gallo, MD   REFERRING PHYSICIAN: Franchot Gallo, MD  DIAGNOSIS: 73 y.o. gentleman with Stage T1c adenocarcinoma of the prostate with Gleason score of 3+4, and PSA of 5.43.    ICD-10-CM   1. Malignant neoplasm of prostate ([place]) C61     HISTORY OF PRESENT ILLNESS: Gregory Reid. is a 73 y.o. male with a diagnosis of prostate cancer. He is an established patient of [facility], followed for nephrolithiasis and renal cyst. He was noted to have an elevated PSA of 5.8 by his primary care physician, Dr. Maudie Mercury, on 03/03/2017.  Accordingly, he was referred for evaluation with Dr. Diona Fanti in 04/2017, with digital rectal examination was performed at that time revealing symmetrical prostate lobes and no nodules.  A repeat PSA was performed at that time and was noted to be 5.33.  This was repeated in March 2019 and was further elevated at 6.21.  Therefore, he elected to proceed with a transrectal ultrasound prostate biopsy which was performed on 01/20/2018 and revealed Gleason 3+4 in 1 core and 3+3 in 3 cores.  Given his relatively low volume disease, he elected for active surveillance at that time. He continued with close monitoring of the PSA which was noted at 5.43 on 05/26/2018.  As part of his active surveillance, he underwent a prostate MRI on 09/02/2018, with which revealed a PI-RADS 3-4 lesion at the left aspect transitional zone with moderate restricted diffusion and incomplete margins, no evidence for high-grade carcinoma within the peripheral zone. Repeat PSA on 09/08/2018 was 6.21. The patient proceeded to MRI fusion biopsy of the prostate on 10/04/2018.  The prostate volume measured 41.29 cc.  Out of 12 standard core biopsies and 4 additional samples of the region of  interest, 6 cores and 2 ROI samples were positive.  The maximum Gleason score was 3+4, and this was seen in 2 out of 4 of the ROI samples, left apex lateral, left mid, left apex, and right mid lateral.  Additionally, Gleason 3+3 disease was seen in the right apex and right apex lateral.  The patient reviewed the biopsy results with his urologist and he has kindly been referred today for discussion of potential radiation treatment options.   PREVIOUS RADIATION THERAPY: No  PAST MEDICAL HISTORY:  Past Medical History:  Diagnosis Date  . Cough   . Gout   . History of kidney stones   . Hyperlipidemia   . Hypertension   . Nephrolithiasis   . Prostate cancer ([place])       PAST SURGICAL HISTORY: Past Surgical History:  Procedure Laterality Date  . EXTRACORPOREAL SHOCK WAVE LITHOTRIPSY Left 07/27/2017   Procedure: LEFT EXTRACORPOREAL SHOCK WAVE LITHOTRIPSY (ESWL);  Surgeon: Festus Aloe, MD;  [facility];  Service: Urology;  Laterality: Left;  . FOOT SURGERY    . LUMBAR LAMINECTOMY/DECOMPRESSION MICRODISCECTOMY Left 02/11/2017   Procedure: LEFT LUMBAR FOUR- LUMBAR FIVE LAMINOTOMY,FORAMINOTOMY,  MICRODISCECTOMY;  Surgeon: Jovita Gamma, MD;  Location: [facility];  Service: Neurosurgery;  Laterality: Left;  . SHOULDER SURGERY      FAMILY HISTORY:  Family History  Problem Relation Age of Onset  . Lung cancer Father 36       smoked approximately 40-45 years then stopped  . Breast cancer Sister   . Prostate cancer Maternal Grandfather   .  Pancreatic cancer Neg Hx   . Colon cancer Neg Hx     SOCIAL HISTORY:  Social History   Socioeconomic History  . Marital status: Married    Spouse name: carol Immunologist  . Number of children: 0  . Years of education: Not on file  . Highest education level: Not on file  Occupational History    Comment: retired  Scientific laboratory technician  . Financial resource strain: Not on file  . Food insecurity:    Worry: Not on file    Inability: Not on file  .  Transportation needs:    Medical: Not on file    Non-medical: Not on file  Tobacco Use  . Smoking status: Never Smoker  . Smokeless tobacco: Never Used  Substance and Sexual Activity  . Alcohol use: No    Comment: rarely  . Drug use: No  . Sexual activity: Not on file  Lifestyle  . Physical activity:    Days per week: Not on file    Minutes per session: Not on file  . Stress: Not on file  Relationships  . Social connections:    Talks on phone: Not on file    Gets together: Not on file    Attends religious service: Not on file    Active member of club or organization: Not on file    Attends meetings of clubs or organizations: Not on file    Relationship status: Not on file  . Intimate partner violence:    Fear of current or ex partner: Not on file    Emotionally abused: Not on file    Physically abused: Not on file    Forced sexual activity: Not on file  Other Topics Concern  . Not on file  Social History Narrative  . Not on file    ALLERGIES: No known allergies  MEDICATIONS:  Current Outpatient Medications  Medication Sig Dispense Refill  . allopurinol (ZYLOPRIM) 300 MG tablet Take 300 mg by mouth every evening.     Marland Kitchen amLODipine (NORVASC) 5 MG tablet Take 5 mg by mouth every evening.     Marland Kitchen aspirin 81 MG tablet Take 1 tablet (81 mg total) every evening by mouth. 30 tablet   . atorvastatin (LIPITOR) 20 MG tablet Take 10 mg by mouth daily at 6 PM.     . cholecalciferol (VITAMIN D) 1000 UNITS tablet Take 1,000 Units by mouth every evening.     . folic acid (FOLVITE) 160 MCG tablet Take 800 mcg by mouth every evening.     . metroNIDAZOLE (METROCREAM) 0.75 % cream APPLY ON SKIN TWICE A DAY    . nebivolol (BYSTOLIC) 5 MG tablet Take 2.5 mg by mouth every evening.     . triamcinolone cream (KENALOG) 0.1 % APPLY TO AFFECTED AREA TWICE A DAY    . valACYclovir (VALTREX) 1000 MG tablet TAKE 1 TABLET BY MOUTH TWICE A DAY FOR 1 DAY AS DIRECTED    . vitamin B-12 (CYANOCOBALAMIN)  1000 MCG tablet Take 1,000 mcg by mouth every evening.      No current facility-administered medications for this encounter.     REVIEW OF SYSTEMS:  On review of systems, the patient reports that he is doing well overall. He denies any chest pain, shortness of breath, cough, fevers, chills, night sweats, unintended weight changes. He denies any bowel disturbances, and denies abdominal pain, nausea or vomiting. He denies any new musculoskeletal or joint aches or pains. His IPSS was 1, indicating mild  to no urinary symptoms. His SHIM was 6, indicating he does have erectile dysfunction. A complete review of systems is obtained and is otherwise negative.    PHYSICAL EXAM:  Wt Readings from Last 3 Encounters:  10/15/18 222 lb 3.2 oz (100.8 kg)  10/12/18 224 lb 3.2 oz (101.7 kg)  07/27/17 210 lb (95.3 kg)   Temp Readings from Last 3 Encounters:  10/15/18 97.8 F (36.6 C) (Oral)  10/12/18 97.7 F (36.5 C) (Oral)  07/27/17 (!) 97.5 F (36.4 C) (Oral)   BP Readings from Last 3 Encounters:  10/15/18 137/75  10/12/18 (!) 151/75  07/27/17 122/64   Pulse Readings from Last 3 Encounters:  10/15/18 71  10/12/18 85  07/27/17 62   Pain Assessment Pain Score: 0-No pain/10  In general this is a well appearing Caucasian gentleman in no acute distress. He is alert and oriented x4 and appropriate throughout the examination. HEENT reveals that the patient is normocephalic, atraumatic. EOMs are intact. PERRLA. Skin is intact without any evidence of gross lesions. Cardiovascular exam reveals a regular rate and rhythm, no clicks rubs or murmurs are auscultated. Chest is clear to auscultation bilaterally. Lymphatic assessment is performed and does not reveal any adenopathy in the cervical, supraclavicular, axillary, or inguinal chains. Abdomen has active bowel sounds in all quadrants and is intact. The abdomen is soft, non tender, non distended. Lower extremities are negative for pretibial pitting edema,  deep calf tenderness, cyanosis or clubbing.   KPS = 100  100 - Normal; no complaints; no evidence of disease. 90   - Able to carry on normal activity; minor signs or symptoms of disease. 80   - Normal activity with effort; some signs or symptoms of disease. 49   - Cares for self; unable to carry on normal activity or to do active work. 60   - Requires occasional assistance, but is able to care for most of his personal needs. 50   - Requires considerable assistance and frequent medical care. 58   - Disabled; requires special care and assistance. 54   - Severely disabled; hospital admission is indicated although death not imminent. 31   - Very sick; hospital admission necessary; active supportive treatment necessary. 10   - Moribund; fatal processes progressing rapidly. 0     - Dead  Karnofsky DA, Abelmann [place], Craver LS and Burchenal [facility] [phone]) The use of the nitrogen mustards in the palliative treatment of carcinoma: with particular reference to bronchogenic carcinoma Cancer 1 [phone]  LABORATORY DATA:  Lab Results  Component Value Date   WBC 7.4 02/09/2017   HGB 14.9 02/09/2017   HCT 44.0 02/09/2017   MCV 96.3 02/09/2017   PLT 174 02/09/2017   Lab Results  Component Value Date   NA 138 02/09/2017   K 4.0 02/09/2017   CL 104 02/09/2017   CO2 25 02/09/2017   No results found for: ALT, AST, GGT, ALKPHOS, BILITOT   RADIOGRAPHY: No results found.    IMPRESSION/PLAN: 1. 74 y.o. gentleman with Stage T1c adenocarcinoma of the prostate with Gleason Score of 3+4, and PSA of 5.43. We discussed the patient's workup and outlined the nature of prostate cancer in this setting. The patient's T stage, Gleason's score, and PSA put him into the favorable intermediate risk group. Accordingly, he is eligible for a variety of potential treatment options including brachytherapy, 5.5 weeks of external radiation or prostatectomy. We discussed the available radiation techniques, and focused on the  details and logistics and delivery. We  discussed and outlined the risks, benefits, short and long-term effects associated with radiotherapy and compared and contrasted these with prostatectomy. We discussed the role of SpaceOAR in reducing the rectal toxicity associated with radiotherapy.  At the end of the conversation the patient is interested in moving forward with brachytherapy and use of SpaceOAR to reduce rectal toxicity from radiotherapy.  He prefers to have his procedure after the first of March, when they return from a planned trip.  We will share our discussion with Dr. Diona Fanti and move forward with scheduling his CT [facility] planning appointment in the near future. The patient met briefly with Romie Jumper in our office who will be working closely with him to coordinate OR scheduling and pre and post procedure appointments.  We will contact the pharmaceutical rep to ensure that [place] is available at the time of procedure.  He will have a prostate MRI following his post-seed CT SIM to confirm appropriate distribution of the [place].  We spent 40 minutes face to face with the patient and more than 50% of that time was spent in counseling and/or coordination of care.    Nicholos Johns, PA-C    Tyler Pita, MD  [facility] Direct Dial: [phone]  Fax: [phone] [email or web address]  Skype  [email or web address]   This document serves as a record of services personally performed by Tyler Pita, MD and Freeman Caldron, PA-C. It was created on their behalf by Wilburn Mylar, a trained medical scribe. The creation of this record is based on the scribe's personal observations and the provider's statements to them. This document has been checked and approved by the attending provider.

## 2018-10-12 NOTE — Progress Notes (Signed)
See progress note under physician encounter. 

## 2018-10-13 ENCOUNTER — Telehealth: Payer: Self-pay | Admitting: *Deleted

## 2018-10-13 DIAGNOSIS — C61 Malignant neoplasm of prostate: Secondary | ICD-10-CM | POA: Insufficient documentation

## 2018-10-13 NOTE — Telephone Encounter (Signed)
CALLED PATIENT TO INFORM OF PRE-SEED APPT. ON 10-15-18, SPOKE WITH PATIENT AND HE IS AWARE OF THIS APPT.

## 2018-10-14 ENCOUNTER — Telehealth: Payer: Self-pay | Admitting: *Deleted

## 2018-10-14 ENCOUNTER — Other Ambulatory Visit: Payer: Self-pay | Admitting: Urology

## 2018-10-14 DIAGNOSIS — K635 Polyp of colon: Secondary | ICD-10-CM | POA: Diagnosis not present

## 2018-10-14 DIAGNOSIS — E785 Hyperlipidemia, unspecified: Secondary | ICD-10-CM | POA: Insufficient documentation

## 2018-10-14 DIAGNOSIS — Z86010 Personal history of colonic polyps: Secondary | ICD-10-CM | POA: Diagnosis not present

## 2018-10-14 DIAGNOSIS — M109 Gout, unspecified: Secondary | ICD-10-CM | POA: Insufficient documentation

## 2018-10-14 DIAGNOSIS — K648 Other hemorrhoids: Secondary | ICD-10-CM | POA: Diagnosis not present

## 2018-10-14 DIAGNOSIS — Z1211 Encounter for screening for malignant neoplasm of colon: Secondary | ICD-10-CM | POA: Diagnosis not present

## 2018-10-14 DIAGNOSIS — Z800 Family history of malignant neoplasm of digestive organs: Secondary | ICD-10-CM | POA: Diagnosis not present

## 2018-10-14 DIAGNOSIS — D122 Benign neoplasm of ascending colon: Secondary | ICD-10-CM | POA: Diagnosis not present

## 2018-10-14 DIAGNOSIS — I10 Essential (primary) hypertension: Secondary | ICD-10-CM | POA: Insufficient documentation

## 2018-10-14 DIAGNOSIS — Z860101 Personal history of adenomatous and serrated colon polyps: Secondary | ICD-10-CM | POA: Insufficient documentation

## 2018-10-14 HISTORY — PX: OTHER SURGICAL HISTORY: SHX169

## 2018-10-14 NOTE — Telephone Encounter (Signed)
Called patient to remind of pre-seed appt. For 10/15/18, lvm for a return call

## 2018-10-15 ENCOUNTER — Encounter: Payer: Self-pay | Admitting: Medical Oncology

## 2018-10-15 ENCOUNTER — Ambulatory Visit
Admission: RE | Admit: 2018-10-15 | Discharge: 2018-10-15 | Disposition: A | Discharge status: Home / Self Care | Payer: Medicare Other | Source: Ambulatory Visit | Attending: Urology | Admitting: Urology

## 2018-10-15 DIAGNOSIS — C61 Malignant neoplasm of prostate: Secondary | ICD-10-CM

## 2018-10-15 DIAGNOSIS — Z510 Encounter for antineoplastic radiation therapy: Secondary | ICD-10-CM | POA: Diagnosis not present

## 2018-10-15 NOTE — Progress Notes (Signed)
  Radiation Oncology         [phone]) [phone] ________________________________  Name: Gregory Reid. MRN: [id number]  Date: 10/15/2018  DOB: 1945/10/28  SIMULATION AND TREATMENT PLANNING NOTE PUBIC ARCH STUDY  CC:Kim, Jeneen Rinks, MD  Jani Gravel, MD  DIAGNOSIS: 73 y.o. gentleman with Stage T1c adenocarcinoma of the prostate with Gleason score of 3+4, and PSA of 5.43.     ICD-10-CM   1. Malignant neoplasm of prostate ([place]) C61     COMPLEX SIMULATION:  The patient presented today for evaluation for possible prostate seed implant. He was brought to the radiation planning suite and placed supine on the CT couch. A 3-dimensional image study set was obtained in upload to the planning computer. There, on each axial slice, I contoured the prostate gland. Then, using three-dimensional radiation planning tools I reconstructed the prostate in view of the structures from the transperineal needle pathway to assess for possible pubic arch interference. In doing so, I did not appreciate any pubic arch interference. Also, the patient's prostate volume was estimated based on the drawn structure. The volume was 41.3 cc.  Given the pubic arch appearance and prostate volume, patient remains a good candidate to proceed with prostate seed implant. Today, he freely provided informed written consent to proceed.    PLAN: The patient will undergo prostate seed implant.   ________________________________  Sheral Apley. Tammi Klippel, M.D.   This document serves as a record of services personally performed by Tyler Pita, MD. It was created on his behalf by Wilburn Mylar, a trained medical scribe. The creation of this record is based on the scribe's personal observations and the provider's statements to them. This document has been checked and approved by the attending provider.

## 2018-10-18 ENCOUNTER — Encounter (HOSPITAL_COMMUNITY)
Admission: RE | Admit: 2018-10-18 | Discharge: 2018-10-18 | Disposition: A | Discharge status: Home / Self Care | Payer: Medicare Other | Source: Ambulatory Visit | Attending: Urology | Admitting: Urology

## 2018-10-18 ENCOUNTER — Ambulatory Visit (HOSPITAL_COMMUNITY)
Admission: RE | Admit: 2018-10-18 | Discharge: 2018-10-18 | Disposition: A | Discharge status: Home / Self Care | Payer: Medicare Other | Source: Ambulatory Visit | Attending: Urology | Admitting: Urology

## 2018-10-18 DIAGNOSIS — Z01818 Encounter for other preprocedural examination: Secondary | ICD-10-CM | POA: Diagnosis not present

## 2018-10-18 DIAGNOSIS — R05 Cough: Secondary | ICD-10-CM | POA: Diagnosis not present

## 2018-11-11 ENCOUNTER — Other Ambulatory Visit: Payer: Self-pay | Admitting: Urology

## 2018-11-11 DIAGNOSIS — C61 Malignant neoplasm of prostate: Secondary | ICD-10-CM

## 2018-12-14 ENCOUNTER — Telehealth: Payer: Self-pay | Admitting: *Deleted

## 2018-12-14 NOTE — Telephone Encounter (Signed)
Called patient to remind of lab appt. for 12-15-18 - arrival time - 8:30 am @ [facility] Admitting, spoke with patient and he is aware of this appt.

## 2018-12-15 ENCOUNTER — Other Ambulatory Visit: Payer: Self-pay

## 2018-12-15 ENCOUNTER — Encounter (HOSPITAL_COMMUNITY): Payer: Self-pay

## 2018-12-15 ENCOUNTER — Encounter (HOSPITAL_COMMUNITY): Payer: Self-pay | Admitting: Physician Assistant

## 2018-12-15 ENCOUNTER — Encounter (HOSPITAL_COMMUNITY)
Admission: RE | Admit: 2018-12-15 | Discharge: 2018-12-15 | Disposition: A | Discharge status: Home / Self Care | Payer: Medicare Other | Source: Ambulatory Visit | Attending: Urology | Admitting: Urology

## 2018-12-15 DIAGNOSIS — C61 Malignant neoplasm of prostate: Secondary | ICD-10-CM | POA: Diagnosis not present

## 2018-12-15 DIAGNOSIS — Z01812 Encounter for preprocedural laboratory examination: Secondary | ICD-10-CM | POA: Insufficient documentation

## 2018-12-15 HISTORY — DX: Benign prostatic hyperplasia with lower urinary tract symptoms: N40.1

## 2018-12-15 HISTORY — DX: Presence of spectacles and contact lenses: Z97.3

## 2018-12-15 LAB — COMPREHENSIVE METABOLIC PANEL
ALT: 26 U/L (ref 0–44)
AST: 17 U/L (ref 15–41)
Albumin: 4.4 g/dL (ref 3.5–5.0)
Alkaline Phosphatase: 82 U/L (ref 38–126)
Anion gap: 11 (ref 5–15)
BUN: 17 mg/dL (ref 8–23)
CO2: 24 mmol/L (ref 22–32)
Calcium: 9.2 mg/dL (ref 8.9–10.3)
Chloride: 105 mmol/L (ref 98–111)
Creatinine, Ser: 1.11 mg/dL (ref 0.61–1.24)
GFR calc Af Amer: >60 mL/min (ref >60)
GFR calc non Af Amer: >60 mL/min (ref >60)
Glucose, Bld: 98 mg/dL (ref 70–99)
Potassium: 4.5 mmol/L (ref 3.5–5.1)
SODIUM: 140 mmol/L (ref 135–145)
Total Bilirubin: 0.7 mg/dL (ref 0.3–1.2)
Total Protein: 7.3 g/dL (ref 6.5–8.1)

## 2018-12-15 LAB — CBC
HCT: 46.4 % (ref 39.0–52.0)
HEMOGLOBIN: 15.4 g/dL (ref 13.0–17.0)
MCH: 33.3 pg (ref 26.0–34.0)
MCHC: 33.2 g/dL (ref 30.0–36.0)
MCV: 100.2 fL — ABNORMAL HIGH (ref 80.0–100.0)
Platelets: 181 10*3/uL (ref 150–400)
RBC: 4.63 MIL/uL (ref 4.22–5.81)
RDW: 12.7 % (ref 11.5–15.5)
WBC: 5.3 10*3/uL (ref 4.0–10.5)
nRBC: 0 % (ref 0.0–0.2)

## 2018-12-15 LAB — PROTIME-INR
INR: 0.9 (ref 0.8–1.2)
PROTHROMBIN TIME: 11.6 s (ref 11.4–15.2)

## 2018-12-15 LAB — APTT: aPTT: 26 seconds (ref 24–36)

## 2018-12-15 MED ORDER — FLEET ENEMA 7-19 GM/118ML RE ENEM
1.0000 | ENEMA | Freq: Once | RECTAL | Status: DC
  Filled 2018-12-15: qty 1

## 2018-12-15 NOTE — Patient Instructions (Addendum)
Gregory Reid.  12/15/2018       Your procedure is scheduled on:  12-22-2018   Report to [facility] Main  Entrance,  Report to admitting at  6:00 AM    Call this number if you have problems the morning of surgery [phone]        Remember: Do not eat food or drink liquids :After Midnight.   This includes no water, candy , gum, mints  BRUSH YOUR TEETH MORNING OF SURGERY AND RINSE YOUR MOUTH OUT          Take these medicines the morning of surgery with A SIP OF WATER:    NONE                                   You may not have any metal on your body including piercings              Do not wear jewelry lotions, powders or perfumes, deodorant                         Men may shave face and neck.       Do not bring valuables to the hospital. [place].  Contacts, dentures or bridgework may not be worn into surgery.  Leave suitcase in the car. After surgery it may be brought to your room.         Patients discharged the day of surgery will not be allowed to drive home. IF YOU ARE HAVING SURGERY AND GOING HOME THE SAME DAY, YOU MUST HAVE AN ADULT TO DRIVE YOU HOME AND BE WITH YOU FOR 24 HOURS. YOU MAY GO HOME BY TAXI OR UBER OR ORTHERWISE, BUT AN ADULT MUST ACCOMPANY YOU HOME AND STAY WITH YOU FOR 24 HOURS.   Name and phone number of your driver:     Special Instructions:   DO ONE FLEET ENEMA AM DAY OF SURGERY              : _____________________________________________________________________             [facility] - Preparing for Surgery Before surgery, you can play an important role.  Because skin is not sterile, your skin needs to be as free of germs as possible.  You can reduce the number of germs on your skin by washing with CHG (chlorahexidine gluconate) soap before surgery.  CHG is an antiseptic cleaner which kills germs and bonds with the skin to continue killing  germs even after washing. Please DO NOT use if you have an allergy to CHG or antibacterial soaps.  If your skin becomes reddened/irritated stop using the CHG and inform your nurse when you arrive at Short Stay. Do not shave (including legs and underarms) for at least 48 hours prior to the first CHG shower.  You may shave your face/neck. Please follow these instructions carefully:  1.  Shower with CHG Soap the night before surgery and the  morning of Surgery.  2.  If you choose to wash your hair, wash your hair first as usual with your  normal  shampoo.  3.  After you shampoo, rinse your hair and body thoroughly to remove  the  shampoo.                          4.  Use CHG as you would any other liquid soap.  You can apply chg directly  to the skin and wash                       Gently with a scrungie or clean washcloth.  5.  Apply the CHG Soap to your body ONLY FROM THE NECK DOWN.   Do not use on face/ open                           Wound or open sores. Avoid contact with eyes, ears mouth and genitals (private parts).                       Wash face,  Genitals (private parts) with your normal soap.             6.  Wash thoroughly, paying special attention to the area where your surgery  will be performed.  7.  Thoroughly rinse your body with warm water from the neck down.  8.  DO NOT shower/wash with your normal soap after using and rinsing off  the CHG Soap.             9.  Pat yourself dry with a clean towel.            10.  Wear clean pajamas.            11.  Place clean sheets on your bed the night of your first shower and do not  sleep with pets. Day of Surgery : Do not apply any lotions/deodorants the morning of surgery.  Please wear clean clothes to the hospital/surgery center.  FAILURE TO FOLLOW THESE INSTRUCTIONS MAY RESULT IN THE CANCELLATION OF YOUR SURGERY PATIENT SIGNATURE_________________________________  NURSE  SIGNATURE__________________________________  ________________________________________________________________________

## 2018-12-15 NOTE — Progress Notes (Addendum)
EKG and CXR 2v  Dated 10-18-2018 in epic.  Pt given pharmacy call service phone number to have medication list completed.

## 2018-12-16 NOTE — Progress Notes (Signed)
Anesthesia Chart Review   Case:  [id number] Date/Time:  12/22/18 0815   Procedures:      RADIOACTIVE SEED IMPLANT/BRACHYTHERAPY IMPLANT (N/A ) - 90 MINS     SPACE OAR INSTILLATION (N/A )   Anesthesia type:  Choice   Pre-op diagnosis:  PROSTATE CANCER   Location:  [place] / [facility]   Surgeon:  Franchot Gallo, MD      DISCUSSION: 73 yo never smoker with h/o HTN, HLD, prostate cancer scheduled for above procedure 12/22/18 with Dr. Franchot Gallo.   Myocardial perfusion imaging 12/28/13, unable to review results.  Per comments from Dr. Candee Furbish, "reassuring results".   Pt can proceed with planned procedure barring acute status change.  VS: BP (!) 170/80 (BP Location: Right Arm)   Pulse 76   Temp 36.7 C (Oral)   Resp 18   Ht 5\' 10"  (1.778 m)   Wt 101.8 kg   SpO2 100%   BMI 32.21 kg/m   PROVIDERS: Jani Gravel, MD is PCP    LABS: Labs reviewed: Acceptable for surgery. (all labs ordered are listed, but only abnormal results are displayed)  Labs Reviewed  CBC - Abnormal; Notable for the following components:      Result Value   MCV 100.2 (*)    All other components within normal limits  APTT  COMPREHENSIVE METABOLIC PANEL  PROTIME-INR     IMAGES: Chest Xray 10/18/18 FINDINGS: The heart size and mediastinal contours are within normal limits. Both lungs are clear. The visualized skeletal structures are unremarkable.  IMPRESSION: No active cardiopulmonary disease.  EKG: 10/18/2018 Rate 75 bpm Sinus rhythm with premature ventricular complexes Otherwise normal ECG   CV:  Past Medical History:  Diagnosis Date  . Gout    per pt stable  . History of kidney stones   . Hyperlipidemia   . Hyperplasia of prostate with lower urinary tract symptoms (LUTS)   . Hypertension   . Nephrolithiasis   . Prostate cancer [facility]) urologist-  dr dahlstedt/  oncologist- dr Tammi Klippel   dx 05/ 2019--- Stage T1c,  Gleason 3+4  . Wears glasses     Past Surgical History:   Procedure Laterality Date  . EXTRACORPOREAL SHOCK WAVE LITHOTRIPSY Left 07/27/2017   Procedure: LEFT EXTRACORPOREAL SHOCK WAVE LITHOTRIPSY (ESWL);  Surgeon: Festus Aloe, MD;  Location[facility];  Service: Urology;  Laterality: Left;  . FOOT SURGERY  1960s   removal foreign body,  per unsure which foot  . LUMBAR LAMINECTOMY/DECOMPRESSION MICRODISCECTOMY Left 02/11/2017   Procedure: LEFT LUMBAR FOUR- LUMBAR FIVE LAMINOTOMY,FORAMINOTOMY,  MICRODISCECTOMY;  Surgeon: Jovita Gamma, MD;  Location: [place];  Service: Neurosurgery;  Laterality: Left;  . SHOULDER ARTHROSCOPY Left 2009    MEDICATIONS: . allopurinol (ZYLOPRIM) 300 MG tablet  . amLODipine (NORVASC) 5 MG tablet  . aspirin 81 MG tablet  . atorvastatin (LIPITOR) 20 MG tablet  . cholecalciferol (VITAMIN D) 1000 UNITS tablet  . folic acid (FOLVITE) 914 MCG tablet  . metroNIDAZOLE (METROCREAM) 0.75 % cream  . nebivolol (BYSTOLIC) 5 MG tablet  . vitamin B-12 (CYANOCOBALAMIN) 1000 MCG tablet  . triamcinolone cream (KENALOG) 0.1 %   No current facility-administered medications for this encounter.     Maia Plan [facility] Pre-Surgical Testing [phone] 12/16/18 10:16 AM

## 2018-12-16 NOTE — Anesthesia Preprocedure Evaluation (Deleted)
Anesthesia Evaluation    Airway        Dental   Pulmonary           Cardiovascular hypertension,      Neuro/Psych    GI/Hepatic   Endo/Other    Renal/GU      Musculoskeletal   Abdominal   Peds  Hematology   Anesthesia Other Findings   Reproductive/Obstetrics                             Anesthesia Physical Anesthesia Plan  ASA:   Anesthesia Plan:    Post-op Pain Management:    Induction:   PONV Risk Score and Plan:   Airway Management Planned:   Additional Equipment:   Intra-op Plan:   Post-operative Plan:   Informed Consent:   Plan Discussed with:   Anesthesia Plan Comments: (See PAT note 12/15/18, Konrad Felix, PA-C)        Anesthesia Quick Evaluation

## 2018-12-20 ENCOUNTER — Encounter (HOSPITAL_COMMUNITY): Payer: Self-pay | Admitting: *Deleted

## 2018-12-20 ENCOUNTER — Other Ambulatory Visit: Payer: Self-pay

## 2018-12-20 NOTE — Progress Notes (Addendum)
Spoke w/ pt via phone for surgery on 12-22-2018 (pt had been cancelled but has been rescheduled same day and time.  Pt verbalized understanding to arrive at [facility] admitting on 12-22-2018 @ 0630.  To be npo after mn.  And do one fleet enema am dos. Also verbalized understanding of currently visitor restriction policy, wife may be come into building, when he's ready to discharge wife will be called by recovery when your ready to go and she can pull up to hospital entrance.  Also to do CHL shower night before and morning of surgery.  Current lab results dated 12-15-2018 in epic.  SPOKE W/  _  Pt via phone     SCREENING SYMPTOMS OF COVID 19:   COUGH--  NO  RUNNY NOSE---   NO  SORE THROAT---  NO  SHORTNESS OF BREATH---  NO  DIFFICULTY BREATHING--- NO  TEMP >100.4-----  NO  HAVE YOU OR ANY FAMILY MEMBER TRAVELLED PAST 14 DAYS OUT OF THE   COUNTY---  NO STATE----  NO COUNTRY----  NO  HAVE YOU OR ANY FAMILY MEMBER BEEN EXPOSED TO ANYONE WITH COVID 19?   NO

## 2018-12-21 ENCOUNTER — Telehealth: Payer: Self-pay | Admitting: *Deleted

## 2018-12-21 NOTE — Telephone Encounter (Signed)
CALLED PATIENT TO REMIND OF PROCEDURE FOR 12-22-18, SPOKE WITH PATIENT AND HE IS AWARE OF THIS PROCEDURE

## 2018-12-21 NOTE — Progress Notes (Signed)
SPOKE W/  Patient via phone     SCREENING SYMPTOMS OF COVID 19:   COUGH--no  RUNNY NOSE--- no  SORE THROAT---no  SHORTNESS OF BREATH---no  DIFFICULTY BREATHING---no  TEMP >100.4-----no  HAVE YOU OR ANY FAMILY MEMBER TRAVELLED PAST 14 DAYS OUT OF THE   COUNTY---no STATE----no COUNTRY----no  HAVE YOU OR ANY FAMILY MEMBER BEEN EXPOSED TO ANYONE WITH COVID 19?   no

## 2018-12-22 ENCOUNTER — Other Ambulatory Visit: Payer: Self-pay

## 2018-12-22 ENCOUNTER — Encounter (HOSPITAL_COMMUNITY): Payer: Self-pay | Admitting: Emergency Medicine

## 2018-12-22 ENCOUNTER — Ambulatory Visit (HOSPITAL_COMMUNITY): Payer: Medicare Other

## 2018-12-22 ENCOUNTER — Ambulatory Visit (HOSPITAL_COMMUNITY): Admission: RE | Admit: 2018-12-22 | Payer: Medicare Other | Source: Other Acute Inpatient Hospital | Admitting: Urology

## 2018-12-22 ENCOUNTER — Encounter (HOSPITAL_COMMUNITY): Admission: RE | Payer: Self-pay | Source: Other Acute Inpatient Hospital

## 2018-12-22 ENCOUNTER — Ambulatory Visit (HOSPITAL_COMMUNITY)
Admission: RE | Admit: 2018-12-22 | Discharge: 2018-12-22 | Disposition: A | Discharge status: Home / Self Care | Payer: Medicare Other | Attending: Urology | Admitting: Urology

## 2018-12-22 ENCOUNTER — Encounter (HOSPITAL_COMMUNITY)
Admission: RE | Disposition: A | Discharge status: Home / Self Care | Payer: Self-pay | Source: Home / Self Care | Attending: Urology

## 2018-12-22 ENCOUNTER — Ambulatory Visit (HOSPITAL_COMMUNITY): Payer: Medicare Other | Admitting: Anesthesiology

## 2018-12-22 DIAGNOSIS — Z801 Family history of malignant neoplasm of trachea, bronchus and lung: Secondary | ICD-10-CM | POA: Insufficient documentation

## 2018-12-22 DIAGNOSIS — E785 Hyperlipidemia, unspecified: Secondary | ICD-10-CM | POA: Insufficient documentation

## 2018-12-22 DIAGNOSIS — Z87442 Personal history of urinary calculi: Secondary | ICD-10-CM | POA: Insufficient documentation

## 2018-12-22 DIAGNOSIS — M109 Gout, unspecified: Secondary | ICD-10-CM | POA: Diagnosis not present

## 2018-12-22 DIAGNOSIS — C61 Malignant neoplasm of prostate: Secondary | ICD-10-CM | POA: Diagnosis not present

## 2018-12-22 DIAGNOSIS — Z800 Family history of malignant neoplasm of digestive organs: Secondary | ICD-10-CM | POA: Insufficient documentation

## 2018-12-22 DIAGNOSIS — E669 Obesity, unspecified: Secondary | ICD-10-CM | POA: Diagnosis not present

## 2018-12-22 DIAGNOSIS — Z6832 Body mass index (BMI) 32.0-32.9, adult: Secondary | ICD-10-CM | POA: Diagnosis not present

## 2018-12-22 DIAGNOSIS — Z8042 Family history of malignant neoplasm of prostate: Secondary | ICD-10-CM | POA: Diagnosis not present

## 2018-12-22 DIAGNOSIS — I10 Essential (primary) hypertension: Secondary | ICD-10-CM | POA: Diagnosis not present

## 2018-12-22 HISTORY — PX: SPACE OAR INSTILLATION: SHX6769

## 2018-12-22 HISTORY — PX: RADIOACTIVE SEED IMPLANT: SHX5150

## 2018-12-22 HISTORY — PX: CYSTOSCOPY: SHX5120

## 2018-12-22 SURGERY — INSERTION, RADIATION SOURCE, PROSTATE
Anesthesia: General | Site: Rectum

## 2018-12-22 SURGERY — INSERTION, RADIATION SOURCE, PROSTATE
Anesthesia: Choice

## 2018-12-22 MED ORDER — FENTANYL CITRATE (PF) 250 MCG/5ML IJ SOLN
INTRAMUSCULAR | Status: AC
  Filled 2018-12-22: qty 5

## 2018-12-22 MED ORDER — PROPOFOL 10 MG/ML IV BOLUS
INTRAVENOUS | Status: AC
  Filled 2018-12-22: qty 20

## 2018-12-22 MED ORDER — EPHEDRINE SULFATE 50 MG/ML IJ SOLN
INTRAMUSCULAR | Status: DC | PRN
  Administered 2018-12-22 (×4): 5 mg via INTRAVENOUS

## 2018-12-22 MED ORDER — EPHEDRINE 5 MG/ML INJ
INTRAVENOUS | Status: AC
  Filled 2018-12-22: qty 10

## 2018-12-22 MED ORDER — ONDANSETRON HCL 4 MG/2ML IJ SOLN
INTRAMUSCULAR | Status: DC | PRN
  Administered 2018-12-22: 4 mg via INTRAVENOUS

## 2018-12-22 MED ORDER — 0.9 % SODIUM CHLORIDE (POUR BTL) OPTIME
TOPICAL | Status: DC | PRN
  Administered 2018-12-22: 1000 mL

## 2018-12-22 MED ORDER — LIDOCAINE 2% (20 MG/ML) 5 ML SYRINGE
INTRAMUSCULAR | Status: AC
  Filled 2018-12-22: qty 5

## 2018-12-22 MED ORDER — PROPOFOL 10 MG/ML IV BOLUS
INTRAVENOUS | Status: DC | PRN
  Administered 2018-12-22: 150 mg via INTRAVENOUS
  Administered 2018-12-22: 20 mg via INTRAVENOUS

## 2018-12-22 MED ORDER — DEXAMETHASONE SODIUM PHOSPHATE 10 MG/ML IJ SOLN
INTRAMUSCULAR | Status: AC
  Filled 2018-12-22: qty 1

## 2018-12-22 MED ORDER — FENTANYL CITRATE (PF) 100 MCG/2ML IJ SOLN
25.0000 ug | INTRAMUSCULAR | Status: DC | PRN
  Administered 2018-12-22 (×2): 50 ug via INTRAVENOUS

## 2018-12-22 MED ORDER — DEXAMETHASONE SODIUM PHOSPHATE 10 MG/ML IJ SOLN
INTRAMUSCULAR | Status: DC | PRN
  Administered 2018-12-22: 8 mg via INTRAVENOUS

## 2018-12-22 MED ORDER — ONDANSETRON HCL 4 MG/2ML IJ SOLN
INTRAMUSCULAR | Status: AC
  Filled 2018-12-22: qty 2

## 2018-12-22 MED ORDER — STERILE WATER FOR IRRIGATION IR SOLN
Status: DC | PRN
  Administered 2018-12-22: 500 mL

## 2018-12-22 MED ORDER — FENTANYL CITRATE (PF) 100 MCG/2ML IJ SOLN
INTRAMUSCULAR | Status: AC
  Filled 2018-12-22: qty 2

## 2018-12-22 MED ORDER — SODIUM CHLORIDE 0.9 % IV SOLN
INTRAVENOUS | Status: DC | PRN
  Administered 2018-12-22: 25 ug/min via INTRAVENOUS

## 2018-12-22 MED ORDER — ROCURONIUM BROMIDE 10 MG/ML (PF) SYRINGE
PREFILLED_SYRINGE | INTRAVENOUS | Status: AC
  Filled 2018-12-22: qty 10

## 2018-12-22 MED ORDER — FENTANYL CITRATE (PF) 100 MCG/2ML IJ SOLN
INTRAMUSCULAR | Status: DC | PRN
  Administered 2018-12-22: 50 ug via INTRAVENOUS
  Administered 2018-12-22 (×4): 25 ug via INTRAVENOUS

## 2018-12-22 MED ORDER — LACTATED RINGERS IV SOLN
INTRAVENOUS | Status: DC
  Administered 2018-12-22 (×2): via INTRAVENOUS

## 2018-12-22 MED ORDER — STERILE WATER FOR IRRIGATION IR SOLN
Status: DC | PRN
  Administered 2018-12-22: 1000 mL via INTRAVESICAL

## 2018-12-22 MED ORDER — CEFAZOLIN SODIUM-DEXTROSE 2-4 GM/100ML-% IV SOLN
2.0000 g | Freq: Once | INTRAVENOUS | Status: AC
  Administered 2018-12-22: 09:00:00 2 g via INTRAVENOUS
  Filled 2018-12-22: qty 100

## 2018-12-22 MED ORDER — ACETAMINOPHEN 500 MG PO TABS
1000.0000 mg | ORAL_TABLET | Freq: Once | ORAL | Status: AC
  Administered 2018-12-22: 1000 mg via ORAL
  Filled 2018-12-22: qty 2

## 2018-12-22 MED ORDER — IOPAMIDOL (ISOVUE-300) INJECTION 61%
INTRAVENOUS | Status: DC | PRN
  Administered 2018-12-22: 09:00:00 5 mL

## 2018-12-22 MED ORDER — ONDANSETRON HCL 4 MG/2ML IJ SOLN: 4.0000 mg | Freq: Once | INTRAMUSCULAR | Status: DC | PRN

## 2018-12-22 MED ORDER — LIDOCAINE HCL (CARDIAC) PF 100 MG/5ML IV SOSY
PREFILLED_SYRINGE | INTRAVENOUS | Status: DC | PRN
  Administered 2018-12-22: 80 mg via INTRAVENOUS

## 2018-12-22 SURGICAL SUPPLY — 30 items
BAG URINE DRAINAGE (UROLOGICAL SUPPLIES) ×4 IMPLANT
BLADE CLIPPER SURG (BLADE) ×4 IMPLANT
CATH FOLEY 2WAY SLVR  5CC 16FR (CATHETERS) ×1
CATH FOLEY 2WAY SLVR 5CC 16FR (CATHETERS) ×3 IMPLANT
CATH ROBINSON RED A/P 20FR (CATHETERS) ×4 IMPLANT
CONT SPEC 4OZ CLIKSEAL STRL BL (MISCELLANEOUS) ×8 IMPLANT
COVER SURGICAL LIGHT HANDLE (MISCELLANEOUS) ×4 IMPLANT
COVER WAND RF STERILE (DRAPES) IMPLANT
DRAPE SURG IRRIG POUCH 19X23 (DRAPES) ×4 IMPLANT
DRSG TEGADERM 4X4.75 (GAUZE/BANDAGES/DRESSINGS) ×8 IMPLANT
DRSG TEGADERM 8X12 (GAUZE/BANDAGES/DRESSINGS) ×8 IMPLANT
GLOVE BIO SURGEON STRL SZ7.5 (GLOVE) ×4 IMPLANT
GLOVE BIOGEL M 8.0 STRL (GLOVE) ×4 IMPLANT
GLOVE ECLIPSE 8.0 STRL XLNG CF (GLOVE) ×4 IMPLANT
GLOVE SURG SS PI 8.0 STRL IVOR (GLOVE) IMPLANT
GOWN STRL REUS W/TWL LRG LVL3 (GOWN DISPOSABLE) ×4 IMPLANT
GOWN STRL REUS W/TWL XL LVL3 (GOWN DISPOSABLE) ×4 IMPLANT
HOLDER FOLEY CATH W/STRAP (MISCELLANEOUS) ×4 IMPLANT
I-SEED AGX100, IODINE-125 DEVIE ×1 IMPLANT
IMPL SPACEOAR SYSTEM 10ML (Spacer) ×3 IMPLANT
IMPLANT SPACEOAR SYSTEM 10ML (Spacer) ×4 IMPLANT
KIT TURNOVER KIT A (KITS) IMPLANT
MARKER SKIN DUAL TIP RULER LAB (MISCELLANEOUS) ×8 IMPLANT
PACK CYSTO (CUSTOM PROCEDURE TRAY) ×4 IMPLANT
STRIP CLOSURE SKIN 1/2X4 (GAUZE/BANDAGES/DRESSINGS) ×4 IMPLANT
SURGILUBE 2OZ TUBE FLIPTOP (MISCELLANEOUS) ×4 IMPLANT
SYR 10ML LL (SYRINGE) ×4 IMPLANT
TOWEL OR 17X26 10 PK STRL BLUE (TOWEL DISPOSABLE) ×4 IMPLANT
UNDERPAD 30X30 (UNDERPADS AND DIAPERS) ×8 IMPLANT
WATER STERILE IRR 1000ML UROMA (IV SOLUTION) ×4 IMPLANT

## 2018-12-22 NOTE — Interval H&P Note (Signed)
History and Physical Interval Note:  12/22/2018 8:09 AM  Gregory Reid.  has presented today for surgery, with the diagnosis of PROSTATE CANCER.  The various methods of treatment have been discussed with the patient and family. After consideration of risks, benefits and other options for treatment, the patient has consented to  Procedure(s) with comments: RADIOACTIVE SEED IMPLANT/BRACHYTHERAPY IMPLANT (N/A) - 90 MINS SPACE OAR INSTILLATION (N/A) as a surgical intervention.  The patient's history has been reviewed, patient examined, no change in status, stable for surgery.  I have reviewed the patient's chart and labs.  Questions were answered to the patient's satisfaction.     Gregory Reid Gregory Reid

## 2018-12-22 NOTE — Anesthesia Preprocedure Evaluation (Addendum)
Anesthesia Evaluation  Patient identified by MRN, date of birth, ID band Patient awake    Reviewed: Allergy & Precautions, NPO status , Patient's Chart, lab work & pertinent test results, reviewed documented beta blocker date and time   Airway Mallampati: III  TM Distance: >3 FB Neck ROM: Full    Dental no notable dental hx.    Pulmonary neg pulmonary ROS,    Pulmonary exam normal breath sounds clear to auscultation       Cardiovascular hypertension, Pt. on medications and Pt. on home beta blockers Normal cardiovascular exam Rhythm:Regular Rate:Normal     Neuro/Psych negative neurological ROS  negative psych ROS   GI/Hepatic negative GI ROS, Neg liver ROS,   Endo/Other  negative endocrine ROS  Renal/GU negative Renal ROS     Musculoskeletal Gout   Abdominal (+) + obese,   Peds  Hematology HLD   Anesthesia Other Findings PROSTATE CANCER  Reproductive/Obstetrics                            Anesthesia Physical Anesthesia Plan  ASA: II  Anesthesia Plan: General   Post-op Pain Management:    Induction: Intravenous  PONV Risk Score and Plan: 2 and Ondansetron, Dexamethasone and Treatment may vary due to age or medical condition  Airway Management Planned: LMA  Additional Equipment:   Intra-op Plan:   Post-operative Plan: Extubation in OR  Informed Consent: I have reviewed the patients History and Physical, chart, labs and discussed the procedure including the risks, benefits and alternatives for the proposed anesthesia with the patient or authorized representative who has indicated his/her understanding and acceptance.     Dental advisory given  Plan Discussed with: CRNA  Anesthesia Plan Comments:        Anesthesia Quick Evaluation

## 2018-12-22 NOTE — H&P (Signed)
H&P  Chief Complaint: Prostate cancer  History of Present Illness: 73 year old male presents for I-125 brachytherapy for mgmt of prostate cancer.  Past Medical History:  Diagnosis Date  . Gout    per pt stable  . History of kidney stones   . Hyperlipidemia   . Hyperplasia of prostate with lower urinary tract symptoms (LUTS)   . Hypertension   . Nephrolithiasis   . Prostate cancer [facility]) urologist-  dr Nereyda Bowler/  oncologist- dr Tammi Klippel   dx 05/ 2019--- Stage T1c,  Gleason 3+4  . Wears glasses     Past Surgical History:  Procedure Laterality Date  . EXTRACORPOREAL SHOCK WAVE LITHOTRIPSY Left 07/27/2017   Procedure: LEFT EXTRACORPOREAL SHOCK WAVE LITHOTRIPSY (ESWL);  Surgeon: Festus Aloe, MD;  Location: [facility];  Service: Urology;  Laterality: Left;  . FOOT SURGERY  1960s   removal foreign body,  per unsure which foot  . LUMBAR LAMINECTOMY/DECOMPRESSION MICRODISCECTOMY Left 02/11/2017   Procedure: LEFT LUMBAR FOUR- LUMBAR FIVE LAMINOTOMY,FORAMINOTOMY,  MICRODISCECTOMY;  Surgeon: Jovita Gamma, MD;  Location: [place];  Service: Neurosurgery;  Laterality: Left;  . SHOULDER ARTHROSCOPY Left 2009    Home Medications:    Allergies: No Known Allergies  Family History  Problem Relation Age of Onset  . Lung cancer Father 81       smoked approximately 40-45 years then stopped  . Breast cancer Sister   . Prostate cancer Maternal Grandfather   . Pancreatic cancer Neg Hx   . Colon cancer Neg Hx     Social History:  reports that he has never smoked. He has never used smokeless tobacco. He reports current alcohol use. He reports that he does not use drugs.  ROS: A complete review of systems was performed.  All systems are negative except for pertinent findings as noted.  Physical Exam:  Vital signs in last 24 hours: Temp:  [97.6 F (36.4 C)] 97.6 F (36.4 C) (04/01 0644) Pulse Rate:  [79] 79 (04/01 0644) Resp:  [15] 15 (04/01 0644) BP: (146)/(81) 146/81 (04/01  0644) SpO2:  [97 %] 97 % (04/01 0644) Weight:  [101.8 kg] 101.8 kg (04/01 0708) Constitutional:  Alert and oriented, No acute distress Cardiovascular: Regular rate  Respiratory: Normal respiratory effort GI: Abdomen is soft, nontender, nondistended, no abdominal masses. No CVAT.  Genitourinary: Normal male phallus, testes are descended bilaterally and non-tender and without masses, scrotum is normal in appearance without lesions or masses, perineum is normal on inspection. Lymphatic: No lymphadenopathy Neurologic: Grossly intact, no focal deficits Psychiatric: Normal mood and affect  Laboratory Data:  No results for input(s): WBC, HGB, HCT, PLT in the last 72 hours.  No results for input(s): NA, K, CL, GLUCOSE, BUN, CALCIUM, CREATININE in the last 72 hours.  Invalid input(s): CO3   No results found for this or any previous visit (from the past 24 hour(s)). No results found for this or any previous visit (from the past 240 hour(s)).  Renal Function: Recent Labs    12/15/18 0932  CREATININE 1.11   Estimated Creatinine Clearance: 71.9 mL/min (by C-G formula based on SCr of 1.11 mg/dL).  Radiologic Imaging: No results found.  Impression/Assessment:  Prostate cancer  Plan:  I-125 brachytherapy

## 2018-12-22 NOTE — Anesthesia Procedure Notes (Signed)
Procedure Name: LMA Insertion Date/Time: 12/22/2018 8:47 AM Performed by: Glory Buff, CRNA Pre-anesthesia Checklist: Patient identified, Emergency Drugs available, Suction available and Patient being monitored Patient Re-evaluated:Patient Re-evaluated prior to induction Oxygen Delivery Method: Circle system utilized Preoxygenation: Pre-oxygenation with 100% oxygen Induction Type: IV induction LMA: LMA inserted LMA Size: 4.0 Number of attempts: 1 Placement Confirmation: positive ETCO2 Tube secured with: Tape Dental Injury: Teeth and Oropharynx as per pre-operative assessment

## 2018-12-22 NOTE — Op Note (Signed)
Preoperative diagnosis: Clinical stage TI C adenocarcinoma the prostate   Postoperative diagnosis: Same   Procedure: I-125 prostate seed implantation, flexible cystoscopy, placement of SpaceOAR  Surgeon: Lillette Boxer. Meaghan Whistler M.D.  Radiation Oncologist: Tyler Pita, M.D.  Anesthesia: Gen.   Indications: Patient  was diagnosed with clinical stage TI C prostate cancer. We had extensive discussion with him about treatment options versus. He elected to proceed with seed implantation. He underwent consultation my office as well as with Dr. Tammi Klippel. He appeared to understand the advantages disadvantages potential risks of this treatment option. Full informed consent has been obtained.   Technique and findings: Patient was brought the operating room where he had successful induction of general anesthesia. He was placed in dorso-lithotomy position and prepped and draped in usual manner. Appropriate surgical timeout was performed. Radiation oncology department placed a transrectal ultrasound probe anchoring stand. Foley catheter with contrast in the balloon was inserted without difficulty. Anchoring needles were placed within the prostate. Rectal tube was placed. Real-time contouring of the urethra prostate and rectum were performed and the dosing parameters were established. Targeted dose was 145 gray.  I was then called  to the operating suite suite for placement of the needles. A second timeout was performed. All needle passage was done with real-time transrectal ultrasound guidance with the sagittal plane. A total of 30 needles were placed.  76 active seeds were implanted.  I then proceeded with placement of SpaceOAR by introducing a needle with the bevel angled inferiorly approximately 2 cm superior to the anus. This was angled downward and under direct ultrasound was placed within the space between the prostatic capsule and rectum. This was confirmed with a small amount of sterile saline injected  and this was performed under direct ultrasound. I then attached the SpaceOAR to the needle and injected this in the space between the prostate and rectum with good placement noted. The Foley catheter was removed and flexible cystoscopy failed to show any seeds outside the prostate.  The patient was brought to recovery room in stable condition, having tolerated the procedure well.Marland Kitchen

## 2018-12-22 NOTE — Anesthesia Postprocedure Evaluation (Signed)
Anesthesia Post Note  Patient: Gregory Reid.  Procedure(s) Performed: RADIOACTIVE SEED IMPLANT/BRACHYTHERAPY IMPLANT (N/A Perineum) SPACE OAR INSTILLATION (N/A Rectum) CYSTOSCOPY (N/A Bladder)     Patient location during evaluation: PACU Anesthesia Type: General Level of consciousness: awake and alert Pain management: pain level controlled Vital Signs Assessment: post-procedure vital signs reviewed and stable Respiratory status: spontaneous breathing, nonlabored ventilation, respiratory function stable and patient connected to nasal cannula oxygen Cardiovascular status: blood pressure returned to baseline and stable Postop Assessment: no apparent nausea or vomiting Anesthetic complications: no    Last Vitals:  Vitals:   12/22/18 1115 12/22/18 1144  BP: 124/72 124/72  Pulse: 84 84  Resp: 14 15  Temp: 36.7 C 36.7 C  SpO2: 92% 98%    Last Pain:  Vitals:   12/22/18 1100  TempSrc:   PainSc: 1                  Irasema Chalk P Jora Galluzzo

## 2018-12-22 NOTE — Transfer of Care (Signed)
Immediate Anesthesia Transfer of Care Note  Patient: Gregory Reid.  Procedure(s) Performed: RADIOACTIVE SEED IMPLANT/BRACHYTHERAPY IMPLANT (N/A Perineum) SPACE OAR INSTILLATION (N/A Rectum) CYSTOSCOPY (N/A Bladder)  Patient Location: PACU  Anesthesia Type:General  Level of Consciousness: awake, alert  and oriented  Airway & Oxygen Therapy: Patient Spontanous Breathing and Patient connected to face mask oxygen  Post-op Assessment: Report given to RN and Post -op Vital signs reviewed and stable  Post vital signs: Reviewed and stable  Last Vitals:  Vitals Value Taken Time  BP 132/73 12/22/2018 10:36 AM  Temp    Pulse 88 12/22/2018 10:38 AM  Resp 14 12/22/2018 10:38 AM  SpO2 98 % 12/22/2018 10:38 AM  Vitals shown include unvalidated device data.  Last Pain:  Vitals:   12/22/18 0708  TempSrc:   PainSc: 0-No pain      Patients Stated Pain Goal: 4 (87/21/58 7276)  Complications: No apparent anesthesia complications

## 2018-12-22 NOTE — Progress Notes (Signed)
Per Dr. Diona Fanti, EKG and CXR from 1/27 ok for surgery.

## 2018-12-22 NOTE — Discharge Instructions (Signed)
Radioactive Seed Implant Home Care Instructions   Activity:    Rest for the remainder of the day.  Do not drive or operate equipment today.  You may resume normal  activities in a few days as instructed by your physician, without risk of harmful radiation exposure to those around you, provided you follow the time and distance precautions on the Radiation Oncology Instruction Sheet.   Meals: Drink plenty of lipuids and eat light foods, such as gelatin or soup this evening .  You may return to normal meal plan tomorrow.  Return To Work: You may return to work as instructed by your physician.  Special Instruction:   If any seeds are found, use tweezers to pick up seeds and place in a glass container of any kind and bring to your physician's office.  Call your physician if any of these symptoms occur:  Persistent or heavy bleeding Urine stream diminishes or stops completely after catheter is removed Fever equal to or greater than 101 degrees F Cloudy urine with a strong foul odor Severe pain  You may feel some burning pain and/or hesitancy when you urinate after the catheter is removed.  These symptoms may increase over the next few weeks, but should diminish within forur to six weeks.  Applying moist heat to the lower abdomen or a hot tub bath may help relieve the pain.  If the discomfort becomes severe, please call your physician for additional medications.   

## 2018-12-25 NOTE — Progress Notes (Signed)
  Radiation Oncology         [phone]) [phone] ________________________________  Name: Dellis Filbert. MRN: [id number]  Date: 12/25/2018  DOB: 12-02-1945       Prostate Seed Implant  CC:Kim, Jeneen Rinks, MD  No ref. provider found  DIAGNOSIS: 73 y.o. gentleman with Stage T1c adenocarcinoma of the prostate with Gleason score of 3+4, and PSA of 5.43.  No diagnosis found.  PROCEDURE: Insertion of radioactive I-125 seeds into the prostate gland.  RADIATION DOSE: 145 Gy, definitive therapy.  TECHNIQUE: Dellis Filbert. was brought to the operating room with the urologist. He was placed in the dorsolithotomy position. He was catheterized and a rectal tube was inserted. The perineum was shaved, prepped and draped. The ultrasound probe was then introduced into the rectum to see the prostate gland.  TREATMENT DEVICE: A needle grid was attached to the ultrasound probe stand and anchor needles were placed.  3D PLANNING: The prostate was imaged in 3D using a sagittal sweep of the prostate probe. These images were transferred to the planning computer. There, the prostate, urethra and rectum were defined on each axial reconstructed image. Then, the software created an optimized 3D plan and a few seed positions were adjusted. The quality of the plan was reviewed using [facility] information for the target and the following two organs at risk:  Urethra and Rectum.  Then the accepted plan was printed and handed off to the radiation therapist.  Under my supervision, the custom loading of the seeds and spacers was carried out and loaded into sealed vicryl sleeves.  These pre-loaded needles were then placed into the needle holder.Marland Kitchen  PROSTATE VOLUME STUDY:  Using transrectal ultrasound the volume of the prostate was verified to be 49.9 cc.  SPECIAL TREATMENT PROCEDURE/SUPERVISION AND HANDLING: The pre-loaded needles were then delivered under sagittal guidance. A total of 30 needles were used to deposit 76 seeds in the  prostate gland. The individual seed activity was 0.495 mCi.  SpaceOAR:  Yes  COMPLEX SIMULATION: At the end of the procedure, an anterior radiograph of the pelvis was obtained to document seed positioning and count. Cystoscopy was performed to check the urethra and bladder.  MICRODOSIMETRY: At the end of the procedure, the patient was emitting 0.135 mR/hr at 1 meter. Accordingly, he was considered safe for hospital discharge.  PLAN: The patient will return to the radiation oncology clinic for post implant CT dosimetry in three weeks.   ________________________________  Sheral Apley Tammi Klippel, M.D.

## 2018-12-27 ENCOUNTER — Encounter (HOSPITAL_COMMUNITY): Payer: Self-pay | Admitting: Urology

## 2019-01-04 ENCOUNTER — Telehealth: Payer: Self-pay | Admitting: *Deleted

## 2019-01-04 NOTE — Telephone Encounter (Signed)
CALLED PATIENT TO REMIND OF APPTS. THAT ARE SCHEDULED FOR 01-05-19, LVM FOR A RETURN CALL

## 2019-01-05 ENCOUNTER — Ambulatory Visit
Admission: RE | Admit: 2019-01-05 | Discharge: 2019-01-05 | Disposition: A | Discharge status: Home / Self Care | Payer: Medicare Other | Source: Ambulatory Visit | Attending: Radiation Oncology | Admitting: Radiation Oncology

## 2019-01-05 ENCOUNTER — Telehealth: Payer: Self-pay | Admitting: Urology

## 2019-01-05 ENCOUNTER — Ambulatory Visit (HOSPITAL_COMMUNITY)
Admission: RE | Admit: 2019-01-05 | Discharge: 2019-01-05 | Disposition: A | Discharge status: Home / Self Care | Payer: Medicare Other | Source: Ambulatory Visit | Attending: Urology | Admitting: Urology

## 2019-01-05 ENCOUNTER — Other Ambulatory Visit: Payer: Self-pay

## 2019-01-05 ENCOUNTER — Ambulatory Visit: Payer: Medicare Other | Admitting: Urology

## 2019-01-05 DIAGNOSIS — Z510 Encounter for antineoplastic radiation therapy: Secondary | ICD-10-CM | POA: Insufficient documentation

## 2019-01-05 DIAGNOSIS — C61 Malignant neoplasm of prostate: Secondary | ICD-10-CM | POA: Diagnosis not present

## 2019-01-05 NOTE — Telephone Encounter (Signed)
Radiation Oncology         [phone]) [phone] ________________________________  Name: Dellis Filbert. MRN: [id number]  Date: 01/05/2019  DOB: [birth year]  Post-Seed Follow-Up Visit Note  CC: Jani Gravel, MD  No ref. provider found  Diagnosis:   73 y.o.gentleman with Stage T1cadenocarcinoma of the prostate with Gleason score of 3+4, and PSA of5.43.    ICD-10-CM   1. Malignant neoplasm of prostate (HCC) C61       Interval Since Last Radiation:  2 weeks 12/22/18: Insertion of radioactive I-125 seeds into the prostate gland; 145 Gy, definitive therapy with SpaceOAR gel placement.  Narrative:  I spoke with the patient to conduct his routine scheduled post-seed follow up visit via telephone to spare the patient unnecessary potential exposure in the healthcare setting during the current COVID-19 pandemic.  The patient was notified in advance and gave permission to proceed with this visit format. He is complaining of increased urinary frequency, particularly at night, and urinary hesitation symptoms but reports that these are tolerable.  He specifically denies dysuria, gross hematuria, excessive daytime frequency, urgency, straining to void, incomplete bladder emptying or incontinence.  He reports a healthy appetite and is maintaining his weight.  He denies abdominal pain, nausea, vomiting, diarrhea or constipation.  Overall, he is quite pleased with his progress to date.  ALLERGIES:  has No Known Allergies.  Meds: Current Outpatient Medications  Medication Sig Dispense Refill  . allopurinol (ZYLOPRIM) 300 MG tablet Take 300 mg by mouth at bedtime.     Marland Kitchen amLODipine (NORVASC) 5 MG tablet Take 5 mg by mouth at bedtime.     Marland Kitchen aspirin 81 MG tablet Take 1 tablet (81 mg total) every evening by mouth. 30 tablet   . atorvastatin (LIPITOR) 20 MG tablet Take 10 mg by mouth at bedtime.     . cholecalciferol (VITAMIN D) 1000 UNITS tablet Take 1,000 Units by mouth every evening.     . folic acid (FOLVITE)  485 MCG tablet Take 800 mcg by mouth every evening.     . metroNIDAZOLE (METROCREAM) 4.62 % cream 1 application 2 (two) times daily as needed (rosacea).     . nebivolol (BYSTOLIC) 5 MG tablet Take 2.5 mg by mouth at bedtime.     . triamcinolone cream (KENALOG) 0.1 % Apply 1 application topically 2 (two) times daily as needed (eczema).     . vitamin B-12 (CYANOCOBALAMIN) 1000 MCG tablet Take 1,000 mcg by mouth every evening.      No current facility-administered medications for this visit.     Physical Findings: Unable to assess due to telephone visit format.  Lab Findings: Lab Results  Component Value Date   WBC 5.3 12/15/2018   HGB 15.4 12/15/2018   HCT 46.4 12/15/2018   MCV 100.2 (H) 12/15/2018   PLT 181 12/15/2018    Radiographic Findings:  Patient underwent CT imaging in our clinic for post implant dosimetry. The CT will be reviewed by Dr. Tammi Klippel to ensure an adequate distribution of radioactive seeds throughout the prostate gland and confirm that there are no seeds in or near the rectum.  He will have an MRI of the prostate later today and these images will be fused with his CT images for further evaluation.  We suspect the final radiation plan and dosimetry will show appropriate coverage of the prostate gland.  He understands that we will call him with any unanticipated findings on his recent scans upon further review.  Impression/Plan: 73 y.o.gentleman with Stage  T1cadenocarcinoma of the prostate with Gleason score of 3+4, and PSA of5.43. The patient is recovering from the effects of radiation. His urinary symptoms should gradually improve over the next 4-6 months. We talked about this today. He is encouraged by his improvement already and is otherwise pleased with his outcome. We also talked about long-term follow-up for prostate cancer following seed implant. He understands that ongoing PSA determinations and digital rectal exams will help perform surveillance to rule out  disease recurrence. He has a follow up appointment scheduled with Dr. Diona Fanti next week. He understands what to expect with his PSA measures. Patient was also educated today about some of the long-term effects from radiation including a small risk for rectal bleeding and possibly erectile dysfunction. We talked about some of the general management approaches to these potential complications. However, I did encourage the patient to contact our office or return at any point if he has questions or concerns related to his previous radiation and prostate cancer.    Nicholos Johns, PA-C

## 2019-01-05 NOTE — Progress Notes (Signed)
  Radiation Oncology         [phone] ________________________________  Name: Gregory Reid. MRN: [id number]  Date: 01/05/2019  DOB: 19-Feb-1946  COMPLEX SIMULATION NOTE  NARRATIVE:  The patient was brought to the [place] today following prostate seed implantation approximately one month ago.  Identity was confirmed.  All relevant records and images related to the planned course of therapy were reviewed.  Then, the patient was set-up supine.  CT images were obtained.  The CT images were loaded into the planning software.  Then the prostate and rectum were contoured.  Treatment planning then occurred.  The implanted iodine 125 seeds were identified by the physics staff for projection of radiation distribution  I have requested : 3D Simulation  I have requested a DVH of the following structures: Prostate and rectum.    ________________________________  Sheral Apley Tammi Klippel, M.D.  This document serves as a record of services personally performed by Tyler Pita, MD. It was created on his behalf by Wilburn Mylar, a trained medical scribe. The creation of this record is based on the scribe's personal observations and the provider's statements to them. This document has been checked and approved by the attending provider.

## 2019-01-11 ENCOUNTER — Encounter: Payer: Self-pay | Admitting: Medical Oncology

## 2019-02-06 IMAGING — MR MR PROSTATE WO/W CM
56 series · 56 of 56 positions shown · IV contrast (Multihance 20ml)
Comparison: None.

CLINICAL DATA: Prostate cancer diagnosed November 2017. PSA equal
5.43. prostate adenocarcinoma in the LEFT lateral mid gland and LEFT
lateral apex. Gleason score 3+3=6. RIGHT apical adenocarcinoma with
Gleason 3+4=7.

EXAM:
MR PROSTATE WITHOUT AND WITH CONTRAST
TECHNIQUE: Multiplanar multisequence MRI images were obtained of the pelvis
centered about the prostate. Pre and post contrast images were
obtained.
CONTRAST:  17mL MULTIHANCE GADOBENATE DIMEGLUMINE 529 MG/ML IV SOLN

[Series 3: T1 · axial · 8.0mm · 1.06mm/px · 1 of 28 slices shown (1 of 2)]
[im 1/28]
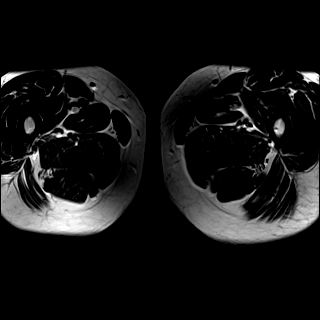

[Series 4: bSSFP fat-sat · axial · 8.0mm · 0.74mm/px · 1 of 28 slices shown]
[im 1/28]
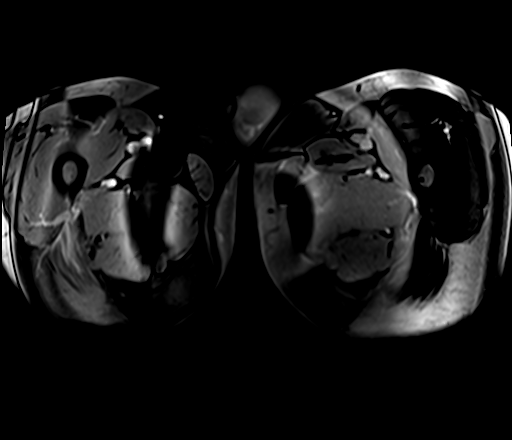

[Series 5: T2 · sagittal · 3.5mm · 0.56mm/px · 1 of 42 slices shown (1 of 4)]
[im 1/42]
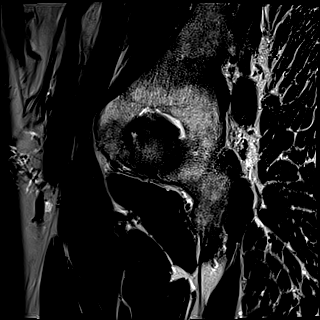

[Series 6: T1 · axial · 3.0mm · 0.31mm/px · 1 of 27 slices shown (2 of 2)]
[im 1/27]
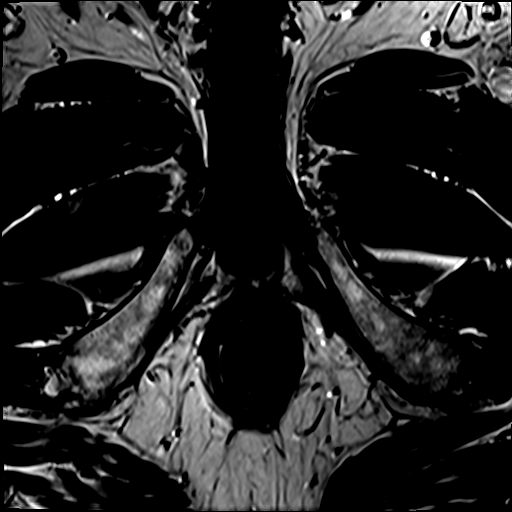

[Series 7: T2 · axial · 3.5mm · 0.56mm/px · 1 of 26 slices shown (2 of 4)]
[im 1/26]
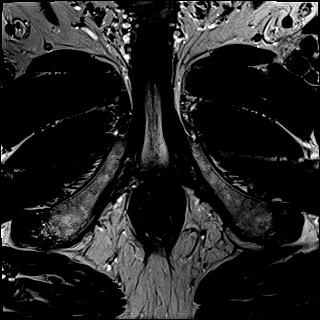

[Series 8: T2 · axial · 1.0mm · 1.04mm/px · 1 of 88 slices shown (3 of 4)]
[im 1/88]
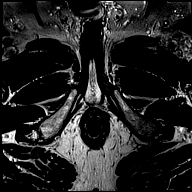

[Series 9: T2 · coronal · 3.5mm · 0.56mm/px · 1 of 23 slices shown (4 of 4)]
[im 1/23]
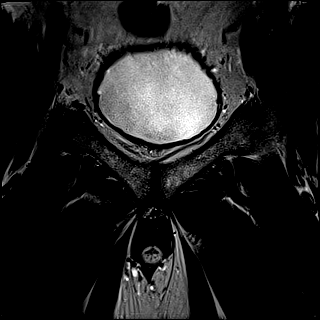

[Series 10: DWI · axial · 3.5mm · 1.56mm/px · 1 of 75 slices shown (1 of 2)]
[im 1/75]
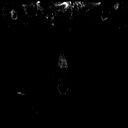

[Series 11: DWI · axial · 3.5mm · 1.56mm/px · 1 of 25 slices shown (2 of 2)]
[im 1/25]
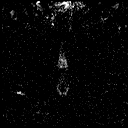

[Series 12: pre t1_twist_tra_dyn_ttc=6.4s · axial · non-contrast · 3.5mm · 0.83mm/px · 1 of 24 slices shown]
[im 1/24]
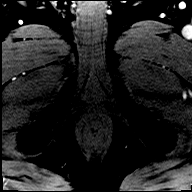

[Series 13: post t1_twist_tra_dyn-copy center · axial · 3.5mm · 0.83mm/px · 1 of 24 slices shown (1 of 24)]
[im 1/24]
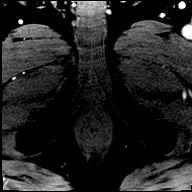

[Series 14: post t1_twist_tra_dyn-copy center · axial · 3.5mm · 0.83mm/px · 1 of 24 slices shown (2 of 24)]
[im 1/24]
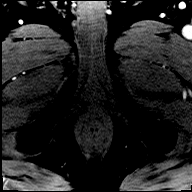

[Series 15: post t1_twist_tra_dyn-copy cent_sub_ttc=(id) · axial · 3.5mm · 0.83mm/px · 1 of 24 slices shown (1 of 22)]
[im 1/24]
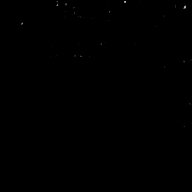

[Series 16: post t1_twist_tra_dyn-copy center · axial · 3.5mm · 0.83mm/px · 1 of 24 slices shown (3 of 24)]
[im 1/24]
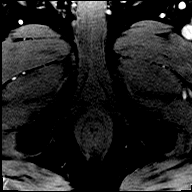

[Series 17: post t1_twist_tra_dyn-copy cent_sub_ttc=(id) · axial · 3.5mm · 0.83mm/px · 1 of 21 slices shown (2 of 22)]
[im 1/21]
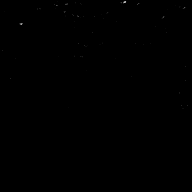

[Series 18: post t1_twist_tra_dyn-copy center · axial · 3.5mm · 0.83mm/px · 1 of 24 slices shown (4 of 24)]
[im 1/24]
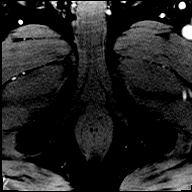

[Series 19: post t1_twist_tra_dyn-copy cent_sub_ttc=(id) · axial · 3.5mm · 0.83mm/px · 1 of 24 slices shown (3 of 22)]
[im 1/24]
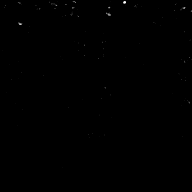

[Series 20: post t1_twist_tra_dyn-copy center · axial · 3.5mm · 0.83mm/px · 1 of 24 slices shown (5 of 24)]
[im 1/24]
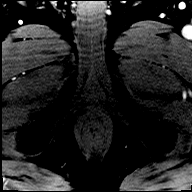

[Series 21: post t1_twist_tra_dyn-copy cent_sub_ttc=(id) · axial · 3.5mm · 0.83mm/px · 1 of 24 slices shown (4 of 22)]
[im 1/24]
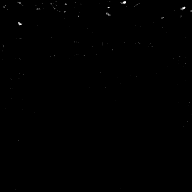

[Series 22: post t1_twist_tra_dyn-copy center · axial · 3.5mm · 0.83mm/px · 1 of 24 slices shown (6 of 24)]
[im 1/24]
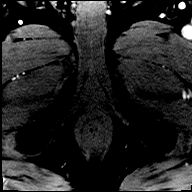

[Series 23: post t1_twist_tra_dyn-copy cent_sub_ttc=(id) · axial · 3.5mm · 0.83mm/px · 1 of 24 slices shown (5 of 22)]
[im 1/24]
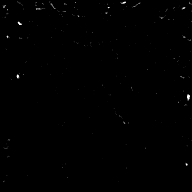

[Series 24: post t1_twist_tra_dyn-copy center · axial · 3.5mm · 0.83mm/px · 1 of 24 slices shown (7 of 24)]
[im 1/24]
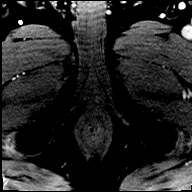

[Series 25: post t1_twist_tra_dyn-copy cent_sub_ttc=(id) · axial · 3.5mm · 0.83mm/px · 1 of 24 slices shown (6 of 22)]
[im 1/24]
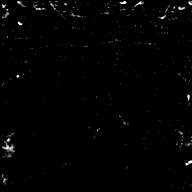

[Series 26: post t1_twist_tra_dyn-copy center · axial · 3.5mm · 0.83mm/px · 1 of 24 slices shown (8 of 24)]
[im 1/24]
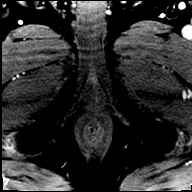

[Series 27: post t1_twist_tra_dyn-copy cent_sub_ttc=(id) · axial · 3.5mm · 0.83mm/px · 1 of 24 slices shown (7 of 22)]
[im 1/24]
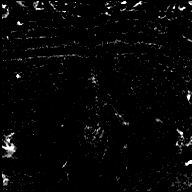

[Series 28: post t1_twist_tra_dyn-copy center · axial · 3.5mm · 0.83mm/px · 1 of 24 slices shown (9 of 24)]
[im 1/24]
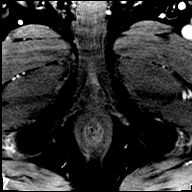

[Series 29: post t1_twist_tra_dyn-copy cent_sub_ttc=(id) · axial · 3.5mm · 0.83mm/px · 1 of 24 slices shown (8 of 22)]
[im 1/24]
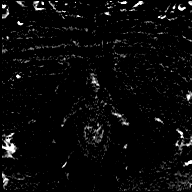

[Series 30: post t1_twist_tra_dyn-copy center · axial · 3.5mm · 0.83mm/px · 1 of 24 slices shown (10 of 24)]
[im 1/24]
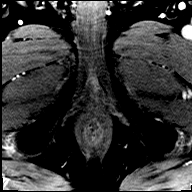

[Series 31: post t1_twist_tra_dyn-copy cent_sub_ttc=(id) · axial · 3.5mm · 0.83mm/px · 1 of 24 slices shown (9 of 22)]
[im 1/24]
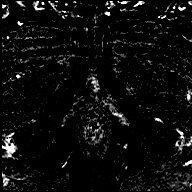

[Series 32: post t1_twist_tra_dyn-copy center · axial · 3.5mm · 0.83mm/px · 1 of 24 slices shown (11 of 24)]
[im 1/24]
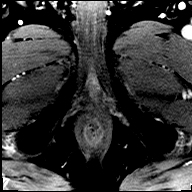

[Series 33: post t1_twist_tra_dyn-copy cent_sub_ttc=(id) · axial · 3.5mm · 0.83mm/px · 1 of 24 slices shown (10 of 22)]
[im 1/24]
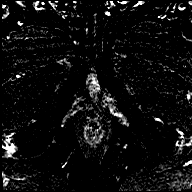

[Series 34: post t1_twist_tra_dyn-copy center · axial · 3.5mm · 0.83mm/px · 1 of 24 slices shown (12 of 24)]
[im 1/24]
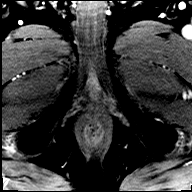

[Series 35: post t1_twist_tra_dyn-copy cent_sub_ttc=(id) · axial · 3.5mm · 0.83mm/px · 1 of 23 slices shown (11 of 22)]
[im 1/23]
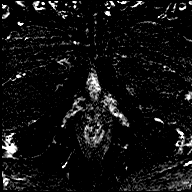

[Series 36: post t1_twist_tra_dyn-copy center · axial · 3.5mm · 0.83mm/px · 1 of 24 slices shown (13 of 24)]
[im 1/24]
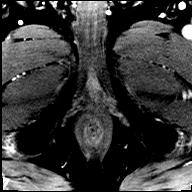

[Series 37: post t1_twist_tra_dyn-copy cent_sub_ttc=(id) · axial · 3.5mm · 0.83mm/px · 1 of 24 slices shown (12 of 22)]
[im 1/24]
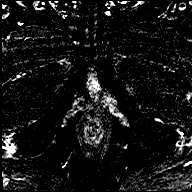

[Series 38: post t1_twist_tra_dyn-copy center · axial · 3.5mm · 0.83mm/px · 1 of 24 slices shown (14 of 24)]
[im 1/24]
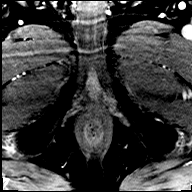

[Series 39: post t1_twist_tra_dyn-copy cent_sub_ttc=(id) · axial · 3.5mm · 0.83mm/px · 1 of 22 slices shown (13 of 22)]
[im 1/22]
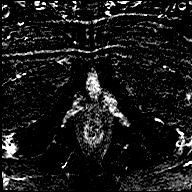

[Series 40: post t1_twist_tra_dyn-copy center · axial · 3.5mm · 0.83mm/px · 1 of 24 slices shown (15 of 24)]
[im 1/24]
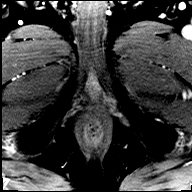

[Series 41: post t1_twist_tra_dyn-copy cent_sub_ttc=(id) · axial · 3.5mm · 0.83mm/px · 1 of 23 slices shown (14 of 22)]
[im 1/23]
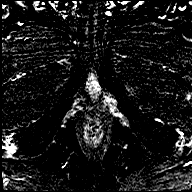

[Series 42: post t1_twist_tra_dyn-copy center · axial · 3.5mm · 0.83mm/px · 1 of 24 slices shown (16 of 24)]
[im 1/24]
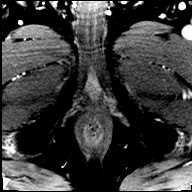

[Series 43: post t1_twist_tra_dyn-copy cent_sub_ttc=(id) · axial · 3.5mm · 0.83mm/px · 1 of 24 slices shown (15 of 22)]
[im 1/24]
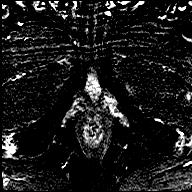

[Series 44: post t1_twist_tra_dyn-copy center · axial · 3.5mm · 0.83mm/px · 1 of 24 slices shown (17 of 24)]
[im 1/24]
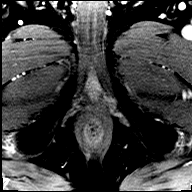

[Series 45: post t1_twist_tra_dyn-copy cent_sub_ttc=(id) · axial · 3.5mm · 0.83mm/px · 1 of 23 slices shown (16 of 22)]
[im 1/23]
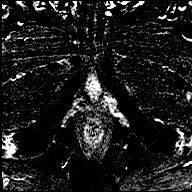

[Series 46: post t1_twist_tra_dyn-copy center · axial · 3.5mm · 0.83mm/px · 1 of 24 slices shown (18 of 24)]
[im 1/24]
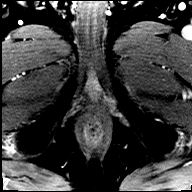

[Series 47: post t1_twist_tra_dyn-copy cent_sub_ttc=(id) · axial · 3.5mm · 0.83mm/px · 1 of 23 slices shown (17 of 22)]
[im 1/23]
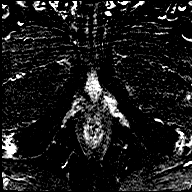

[Series 48: post t1_twist_tra_dyn-copy center · axial · 3.5mm · 0.83mm/px · 1 of 24 slices shown (19 of 24)]
[im 1/24]
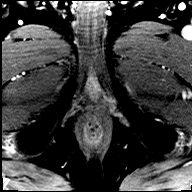

[Series 49: post t1_twist_tra_dyn-copy cent_sub_ttc=(id) · axial · 3.5mm · 0.83mm/px · 1 of 24 slices shown (18 of 22)]
[im 1/24]
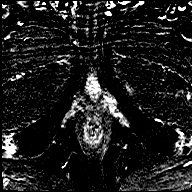

[Series 50: post t1_twist_tra_dyn-copy center · axial · 3.5mm · 0.83mm/px · 1 of 24 slices shown (20 of 24)]
[im 1/24]
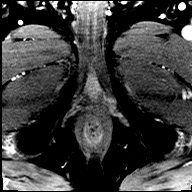

[Series 51: post t1_twist_tra_dyn-copy cent_sub_ttc=(id) · axial · 3.5mm · 0.83mm/px · 1 of 24 slices shown (19 of 22)]
[im 1/24]
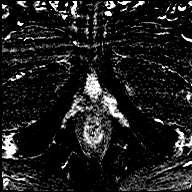

[Series 52: post t1_twist_tra_dyn-copy center · axial · 3.5mm · 0.83mm/px · 1 of 24 slices shown (21 of 24)]
[im 1/24]
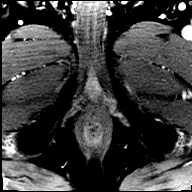

[Series 53: post t1_twist_tra_dyn-copy cent_sub_ttc=(id) · axial · 3.5mm · 0.83mm/px · 1 of 23 slices shown (20 of 22)]
[im 1/23]
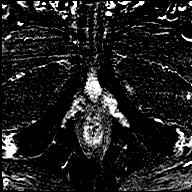

[Series 54: post t1_twist_tra_dyn-copy center · axial · 3.5mm · 0.83mm/px · 1 of 24 slices shown (22 of 24)]
[im 1/24]
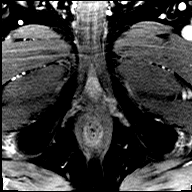

[Series 55: post t1_twist_tra_dyn-copy cent_sub_ttc=(id) · axial · 3.5mm · 0.83mm/px · 1 of 24 slices shown (21 of 22)]
[im 1/24]
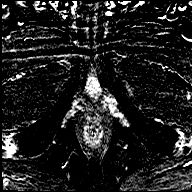

[Series 56: post t1_twist_tra_dyn-copy center · axial · 3.5mm · 0.83mm/px · 1 of 24 slices shown (23 of 24)]
[im 1/24]
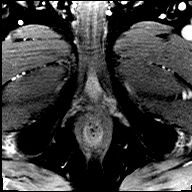

[Series 57: post t1_twist_tra_dyn-copy cent_sub_ttc=(id) · axial · 3.5mm · 0.83mm/px · 1 of 24 slices shown (22 of 22)]
[im 1/24]
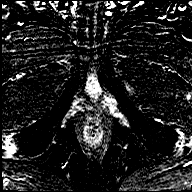

[Series 58: post t1_twist_tra_dyn-copy center · axial · 3.5mm · 0.83mm/px · 1 of 24 slices shown (24 of 24)]
[im 1/24]
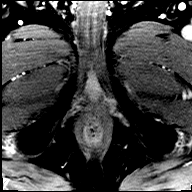

[56 of 56 positions shown; findings below may reference images not displayed]

FINDINGS: Prostate: The peripheral zone is mildly thinned by the enlarged
transitional zone. No discrete focal T2 abnormality in the
peripheral zone. There is some T1 hyperintense hemorrhage in the
peripheral zone from prior biopsy.

Within the transitional zone, there is round nodule in the LEFT
gland which has atypical homogeneous smudgy signal intensity low
intensity measuring 2.0 x 2.2 by 2.0 cm (image [DATE]). No clear
capsule around portions of lesion. There is mild restricted
diffusion through this lesion (image [DATE]). This lesion also has
early arterial enhancement which is not necessary specific in the
transitional zone but noted.

Volume: 3.2 x 3.0 x 4.8 cm (volume = 24 cm^3)

Transcapsular spread:  Absent

Seminal vesicle involvement: Absent

Neurovascular bundle involvement: Absent

Pelvic adenopathy: Absent

Bone metastasis: Absent

Other findings: None
IMPRESSION: 1. Round bland lesion in the LEFT aspect the transitional zone with
moderate restricted diffusion and incomplete margins is concerning
for prostate adenocarcinoma PI-RADS: 3 to 4 . Lesion centered in the
mid gland. 3D post processing (Dynacad) performed.
2. No evidence for high-grade carcinoma within the peripheral zone.
3. No extracapsular extension.  Normal seminal vesicles.
4. No lymphadenopathy.

## 2019-03-16 ENCOUNTER — Encounter: Payer: Self-pay | Admitting: Radiation Oncology

## 2019-03-21 ENCOUNTER — Telehealth: Payer: Self-pay | Admitting: *Deleted

## 2019-03-21 NOTE — Telephone Encounter (Signed)
RETURNED PATIENT'S PHONE CALL, LVM FOR A RETURN CALL 

## 2019-03-24 IMAGING — DX DG CHEST 2V
2 series · 2 of 2 positions shown · non-contrast
Comparison: None available currently.

CLINICAL DATA: Preop exam, cough.

EXAM:
CHEST - 2 VIEW

[chest pa]
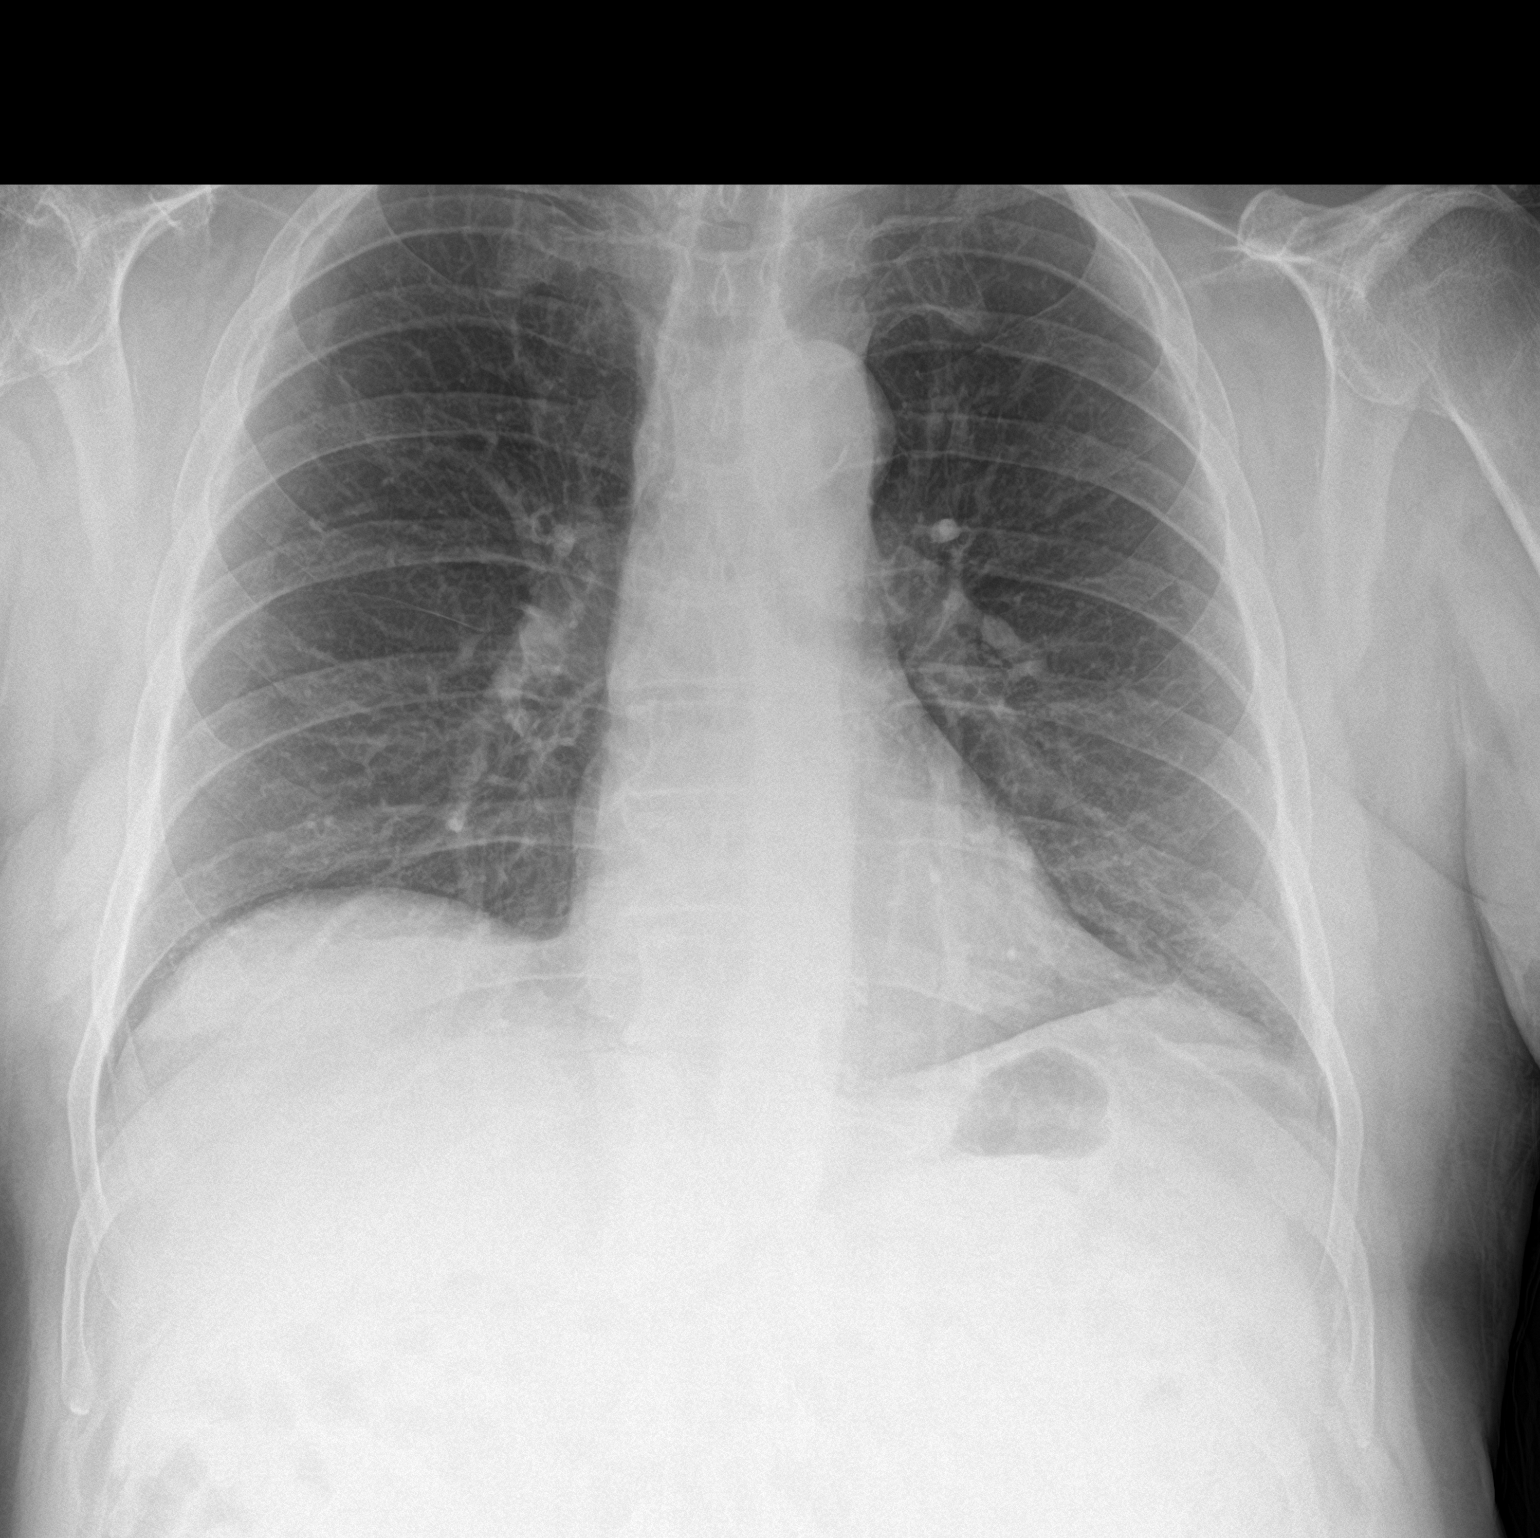

[chest lat]
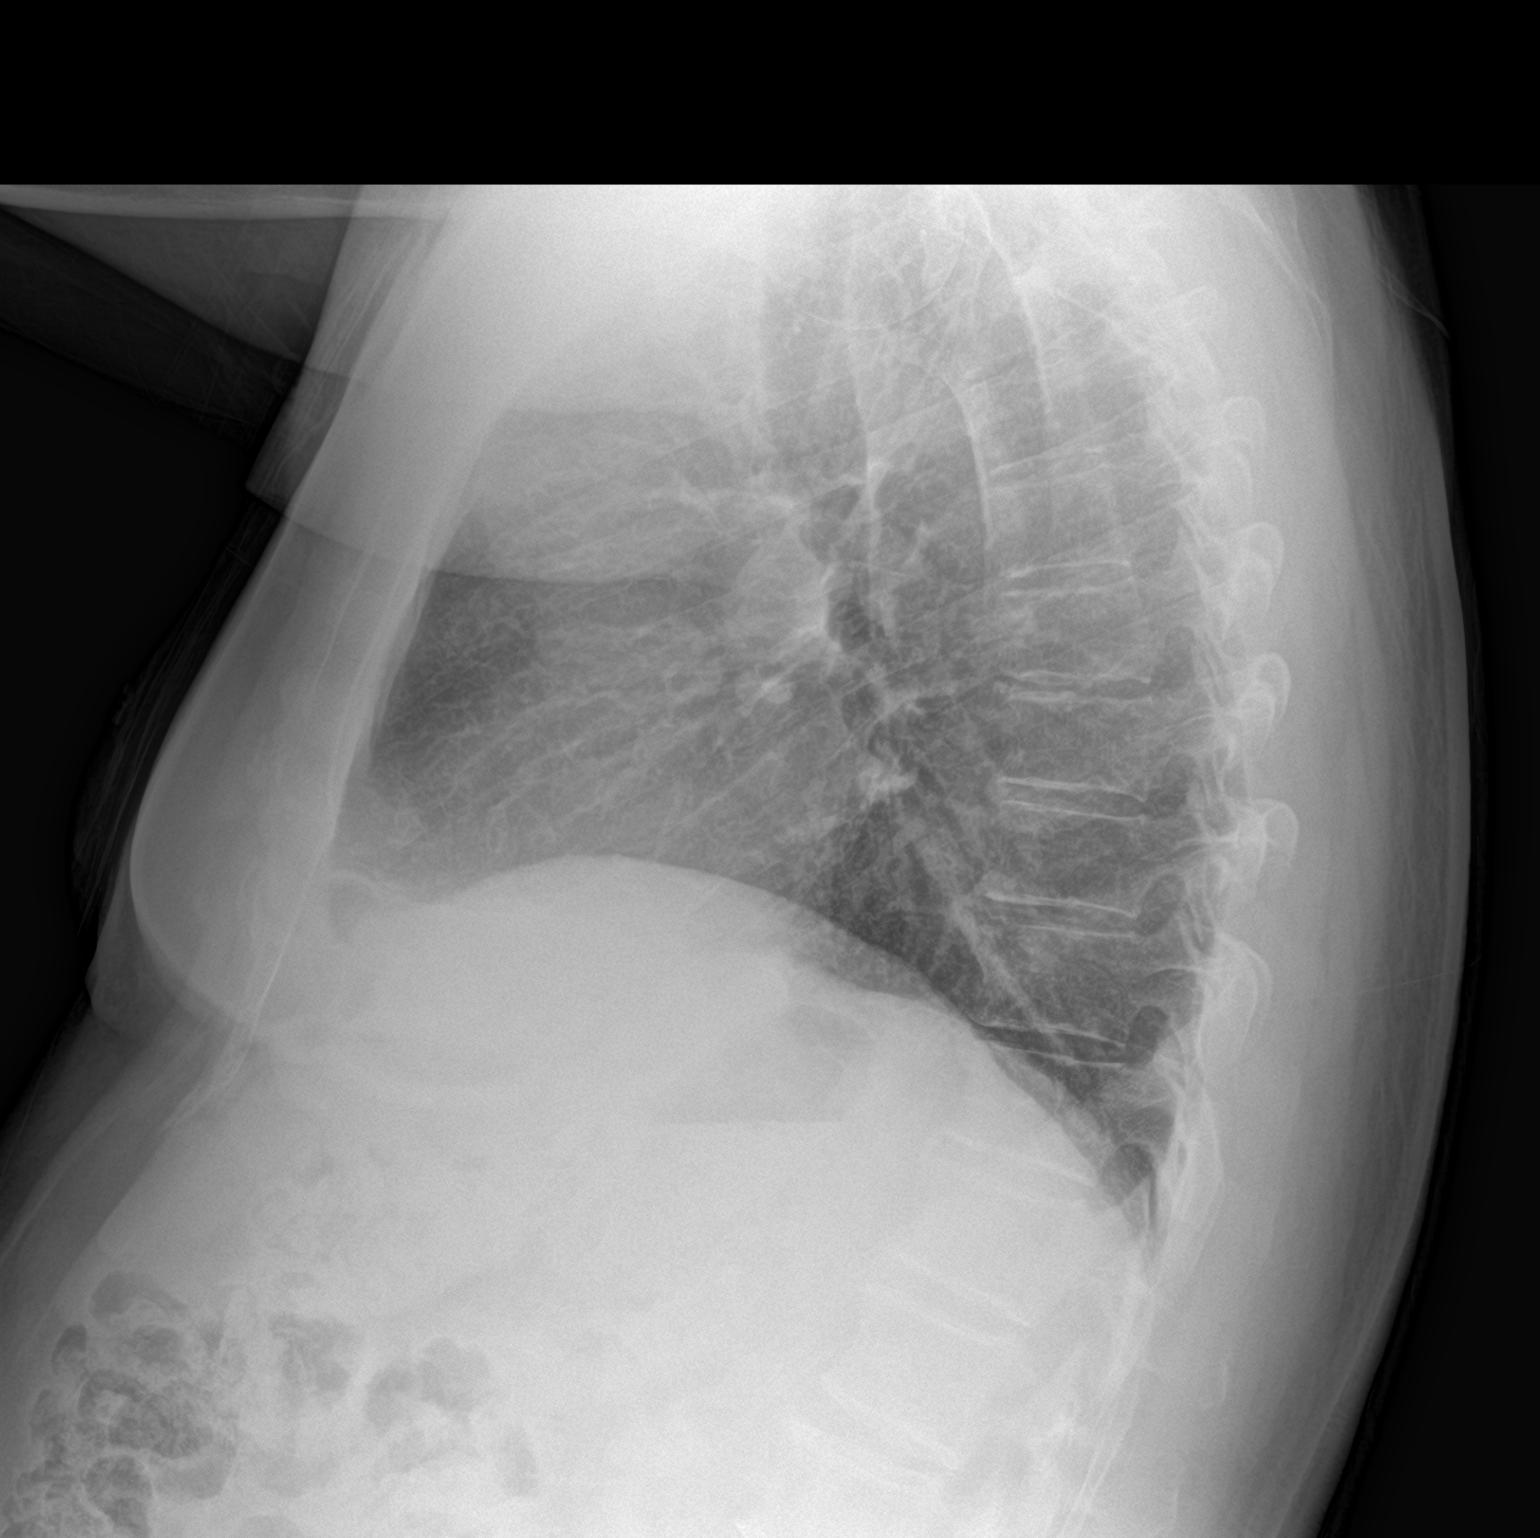

[2 of 2 positions shown; findings below may reference images not displayed]

FINDINGS: The heart size and mediastinal contours are within normal limits.
Both lungs are clear. The visualized skeletal structures are
unremarkable.
IMPRESSION: No active cardiopulmonary disease.

## 2019-04-10 NOTE — Progress Notes (Signed)
  Radiation Oncology         [phone]) [phone] ________________________________  Name: Gregory Reid. MRN: [id number]  Date: 03/16/2019  DOB: Aug 11, 1946  3D Planning Note   Prostate Brachytherapy Post-Implant Dosimetry  Diagnosis: 73 y.o. gentleman with Stage T1c adenocarcinoma of the prostate with Gleason score of 3+4, and PSA of 5.43  Narrative: On a previous date, Gregory Reid. returned following prostate seed implantation for post implant planning. He underwent CT scan complex simulation to delineate the three-dimensional structures of the pelvis and demonstrate the radiation distribution.  Since that time, the seed localization, and complex isodose planning with dose volume histograms have now been completed.  Results:   Prostate Coverage - The dose of radiation delivered to the 90% or more of the prostate gland (D90) was 108.49% of the prescription dose. This exceeds our goal of greater than 90%. Rectal Sparing - The volume of rectal tissue receiving the prescription dose or higher was 0.0 cc. This falls under our thresholds tolerance of 1.0 cc.  Impression: The prostate seed implant appears to show adequate target coverage and appropriate rectal sparing.  Plan:  The patient will continue to follow with urology for ongoing PSA determinations. I would anticipate a high likelihood for local tumor control with minimal risk for rectal morbidity.  ________________________________  Sheral Apley Tammi Klippel, M.D.

## 2019-04-15 DIAGNOSIS — M546 Pain in thoracic spine: Secondary | ICD-10-CM | POA: Diagnosis not present

## 2019-04-15 DIAGNOSIS — M47816 Spondylosis without myelopathy or radiculopathy, lumbar region: Secondary | ICD-10-CM | POA: Diagnosis not present

## 2019-04-15 DIAGNOSIS — G5710 Meralgia paresthetica, unspecified lower limb: Secondary | ICD-10-CM | POA: Insufficient documentation

## 2019-04-15 DIAGNOSIS — M5136 Other intervertebral disc degeneration, lumbar region: Secondary | ICD-10-CM | POA: Diagnosis not present

## 2019-04-15 DIAGNOSIS — M5416 Radiculopathy, lumbar region: Secondary | ICD-10-CM | POA: Diagnosis not present

## 2019-04-15 DIAGNOSIS — G5712 Meralgia paresthetica, left lower limb: Secondary | ICD-10-CM | POA: Diagnosis not present

## 2019-05-06 DIAGNOSIS — L309 Dermatitis, unspecified: Secondary | ICD-10-CM | POA: Diagnosis not present

## 2019-05-06 DIAGNOSIS — L0889 Other specified local infections of the skin and subcutaneous tissue: Secondary | ICD-10-CM | POA: Diagnosis not present

## 2019-05-06 DIAGNOSIS — L011 Impetiginization of other dermatoses: Secondary | ICD-10-CM | POA: Diagnosis not present

## 2019-06-03 DIAGNOSIS — Z23 Encounter for immunization: Secondary | ICD-10-CM | POA: Diagnosis not present

## 2019-07-06 DIAGNOSIS — H52203 Unspecified astigmatism, bilateral: Secondary | ICD-10-CM | POA: Diagnosis not present

## 2019-07-06 DIAGNOSIS — H2513 Age-related nuclear cataract, bilateral: Secondary | ICD-10-CM | POA: Diagnosis not present

## 2019-09-26 DIAGNOSIS — I10 Essential (primary) hypertension: Secondary | ICD-10-CM | POA: Diagnosis not present

## 2019-09-26 DIAGNOSIS — E7800 Pure hypercholesterolemia, unspecified: Secondary | ICD-10-CM | POA: Diagnosis not present

## 2019-09-26 DIAGNOSIS — M109 Gout, unspecified: Secondary | ICD-10-CM | POA: Diagnosis not present

## 2019-09-26 DIAGNOSIS — Z0000 Encounter for general adult medical examination without abnormal findings: Secondary | ICD-10-CM | POA: Diagnosis not present

## 2019-10-03 DIAGNOSIS — L821 Other seborrheic keratosis: Secondary | ICD-10-CM | POA: Diagnosis not present

## 2019-10-03 DIAGNOSIS — D225 Melanocytic nevi of trunk: Secondary | ICD-10-CM | POA: Diagnosis not present

## 2019-10-03 DIAGNOSIS — Z0000 Encounter for general adult medical examination without abnormal findings: Secondary | ICD-10-CM | POA: Diagnosis not present

## 2019-10-03 DIAGNOSIS — E7800 Pure hypercholesterolemia, unspecified: Secondary | ICD-10-CM | POA: Diagnosis not present

## 2019-10-03 DIAGNOSIS — B351 Tinea unguium: Secondary | ICD-10-CM | POA: Diagnosis not present

## 2019-10-03 DIAGNOSIS — D1801 Hemangioma of skin and subcutaneous tissue: Secondary | ICD-10-CM | POA: Diagnosis not present

## 2019-10-03 DIAGNOSIS — L814 Other melanin hyperpigmentation: Secondary | ICD-10-CM | POA: Diagnosis not present

## 2019-10-03 DIAGNOSIS — I10 Essential (primary) hypertension: Secondary | ICD-10-CM | POA: Diagnosis not present

## 2019-10-03 DIAGNOSIS — L570 Actinic keratosis: Secondary | ICD-10-CM | POA: Diagnosis not present

## 2020-03-27 DIAGNOSIS — I10 Essential (primary) hypertension: Secondary | ICD-10-CM | POA: Diagnosis not present

## 2020-04-09 DIAGNOSIS — E7800 Pure hypercholesterolemia, unspecified: Secondary | ICD-10-CM | POA: Diagnosis not present

## 2020-04-09 DIAGNOSIS — R5383 Other fatigue: Secondary | ICD-10-CM | POA: Diagnosis not present

## 2020-04-09 DIAGNOSIS — I10 Essential (primary) hypertension: Secondary | ICD-10-CM | POA: Diagnosis not present

## 2020-06-21 DIAGNOSIS — Z23 Encounter for immunization: Secondary | ICD-10-CM | POA: Diagnosis not present

## 2020-07-11 DIAGNOSIS — H02834 Dermatochalasis of left upper eyelid: Secondary | ICD-10-CM | POA: Diagnosis not present

## 2020-07-11 DIAGNOSIS — H02831 Dermatochalasis of right upper eyelid: Secondary | ICD-10-CM | POA: Diagnosis not present

## 2020-07-11 DIAGNOSIS — H52203 Unspecified astigmatism, bilateral: Secondary | ICD-10-CM | POA: Diagnosis not present

## 2020-07-11 DIAGNOSIS — H2513 Age-related nuclear cataract, bilateral: Secondary | ICD-10-CM | POA: Diagnosis not present

## 2020-07-13 DIAGNOSIS — R110 Nausea: Secondary | ICD-10-CM | POA: Diagnosis not present

## 2020-07-13 DIAGNOSIS — N200 Calculus of kidney: Secondary | ICD-10-CM | POA: Diagnosis not present

## 2020-07-13 DIAGNOSIS — I700 Atherosclerosis of aorta: Secondary | ICD-10-CM | POA: Diagnosis not present

## 2020-07-13 DIAGNOSIS — N202 Calculus of kidney with calculus of ureter: Secondary | ICD-10-CM | POA: Diagnosis not present

## 2020-07-18 ENCOUNTER — Other Ambulatory Visit: Payer: Self-pay | Admitting: Urology

## 2020-07-19 ENCOUNTER — Other Ambulatory Visit (HOSPITAL_COMMUNITY)
Admission: RE | Admit: 2020-07-19 | Discharge: 2020-07-19 | Disposition: A | Discharge status: Home / Self Care | Payer: Medicare Other | Source: Ambulatory Visit | Attending: Urology | Admitting: Urology

## 2020-07-19 DIAGNOSIS — Z01812 Encounter for preprocedural laboratory examination: Secondary | ICD-10-CM | POA: Insufficient documentation

## 2020-07-19 DIAGNOSIS — Z20822 Contact with and (suspected) exposure to covid-19: Secondary | ICD-10-CM | POA: Diagnosis not present

## 2020-07-19 LAB — SARS CORONAVIRUS 2 (TAT 6-24 HRS): SARS Coronavirus 2: NEGATIVE

## 2020-07-20 NOTE — Progress Notes (Signed)
Talked with patient. NPO after MN.Mar[facility] Kitchen Arrival time 0800.Instructions given. Wife is the dirver

## 2020-07-23 ENCOUNTER — Ambulatory Visit (HOSPITAL_BASED_OUTPATIENT_CLINIC_OR_DEPARTMENT_OTHER)
Admission: RE | Admit: 2020-07-23 | Discharge: 2020-07-23 | Disposition: A | Discharge status: Home / Self Care | Payer: Medicare Other | Attending: Urology | Admitting: Urology

## 2020-07-23 ENCOUNTER — Ambulatory Visit (HOSPITAL_COMMUNITY): Payer: Medicare Other

## 2020-07-23 ENCOUNTER — Encounter (HOSPITAL_BASED_OUTPATIENT_CLINIC_OR_DEPARTMENT_OTHER)
Admission: RE | Disposition: A | Discharge status: Home / Self Care | Payer: Self-pay | Source: Home / Self Care | Attending: Urology

## 2020-07-23 ENCOUNTER — Other Ambulatory Visit: Payer: Self-pay

## 2020-07-23 ENCOUNTER — Encounter (HOSPITAL_BASED_OUTPATIENT_CLINIC_OR_DEPARTMENT_OTHER): Payer: Self-pay | Admitting: Urology

## 2020-07-23 DIAGNOSIS — Z8249 Family history of ischemic heart disease and other diseases of the circulatory system: Secondary | ICD-10-CM | POA: Insufficient documentation

## 2020-07-23 DIAGNOSIS — I10 Essential (primary) hypertension: Secondary | ICD-10-CM | POA: Diagnosis not present

## 2020-07-23 DIAGNOSIS — N200 Calculus of kidney: Secondary | ICD-10-CM | POA: Insufficient documentation

## 2020-07-23 DIAGNOSIS — Z79899 Other long term (current) drug therapy: Secondary | ICD-10-CM | POA: Diagnosis not present

## 2020-07-23 DIAGNOSIS — Z923 Personal history of irradiation: Secondary | ICD-10-CM | POA: Diagnosis not present

## 2020-07-23 DIAGNOSIS — Z8546 Personal history of malignant neoplasm of prostate: Secondary | ICD-10-CM | POA: Diagnosis not present

## 2020-07-23 DIAGNOSIS — Z7982 Long term (current) use of aspirin: Secondary | ICD-10-CM | POA: Diagnosis not present

## 2020-07-23 DIAGNOSIS — N2889 Other specified disorders of kidney and ureter: Secondary | ICD-10-CM | POA: Diagnosis not present

## 2020-07-23 DIAGNOSIS — Z801 Family history of malignant neoplasm of trachea, bronchus and lung: Secondary | ICD-10-CM | POA: Diagnosis not present

## 2020-07-23 DIAGNOSIS — Z87442 Personal history of urinary calculi: Secondary | ICD-10-CM | POA: Diagnosis not present

## 2020-07-23 HISTORY — PX: EXTRACORPOREAL SHOCK WAVE LITHOTRIPSY: SHX1557

## 2020-07-23 SURGERY — LITHOTRIPSY, ESWL
Anesthesia: LOCAL | Laterality: Right

## 2020-07-23 MED ORDER — DIAZEPAM 5 MG PO TABS
ORAL_TABLET | ORAL | Status: AC
  Filled 2020-07-23: qty 2

## 2020-07-23 MED ORDER — DIPHENHYDRAMINE HCL 25 MG PO CAPS
ORAL_CAPSULE | ORAL | Status: AC
  Filled 2020-07-23: qty 1

## 2020-07-23 MED ORDER — SODIUM CHLORIDE 0.9% FLUSH: 3.0000 mL | Freq: Two times a day (BID) | INTRAVENOUS | Status: DC

## 2020-07-23 MED ORDER — DIPHENHYDRAMINE HCL 25 MG PO CAPS
25.0000 mg | ORAL_CAPSULE | ORAL | Status: AC
  Administered 2020-07-23: 25 mg via ORAL

## 2020-07-23 MED ORDER — SODIUM CHLORIDE 0.9 % IV SOLN: INTRAVENOUS | Status: DC

## 2020-07-23 MED ORDER — CIPROFLOXACIN HCL 500 MG PO TABS
ORAL_TABLET | ORAL | Status: AC
  Filled 2020-07-23: qty 1

## 2020-07-23 MED ORDER — DIAZEPAM 5 MG PO TABS
10.0000 mg | ORAL_TABLET | ORAL | Status: AC
  Administered 2020-07-23: 10 mg via ORAL

## 2020-07-23 MED ORDER — CIPROFLOXACIN HCL 500 MG PO TABS
500.0000 mg | ORAL_TABLET | ORAL | Status: AC
  Administered 2020-07-23: 500 mg via ORAL

## 2020-07-23 NOTE — Discharge Instructions (Signed)
Lithotripsy, Care After This sheet gives you information about how to care for yourself after your procedure. Your health care provider may also give you more specific instructions. If you have problems or questions, contact your health care provider. What can I expect after the procedure? After the procedure, it is common to have:  Some blood in your urine. This should only last for a few days.  Soreness in your back, sides, or upper abdomen for a few days.  Blotches or bruises on your back where the pressure wave entered the skin.  Pain, discomfort, or nausea when pieces (fragments) of the kidney stone move through the tube that carries urine from the kidney to the bladder (ureter). Stone fragments may pass soon after the procedure, but they may continue to pass for up to 4-8 weeks. ? If you have severe pain or nausea, contact your health care provider. This may be caused by a large stone that was not broken up, and this may mean that you need more treatment.  Some pain or discomfort during urination.  Some pain or discomfort in the lower abdomen or (in men) at the base of the penis. Follow these instructions at home: Medicines  Take over-the-counter and prescription medicines only as told by your health care provider.  If you were prescribed an antibiotic medicine, take it as told by your health care provider. Do not stop taking the antibiotic even if you start to feel better.  Do not drive for 24 hours if you were given a medicine to help you relax (sedative).  Do not drive or use heavy machinery while taking prescription pain medicine. Eating and drinking      Drink enough water and fluids to keep your urine clear or pale yellow. This helps any remaining pieces of the stone to pass. It can also help prevent new stones from forming.  Eat plenty of fresh fruits and vegetables.  Follow instructions from your health care provider about eating and drinking restrictions. You may be  instructed: ? To reduce how much salt (sodium) you eat or drink. Check ingredients and nutrition facts on packaged foods and beverages. ? To reduce how much meat you eat.  Eat the recommended amount of calcium for your age and gender. Ask your health care provider how much calcium you should have. General instructions  Get plenty of rest.  Most people can resume normal activities 1-2 days after the procedure. Ask your health care provider what activities are safe for you.  Your health care provider may direct you to lie in a certain position (postural drainage) and tap firmly (percuss) over your kidney area to help stone fragments pass. Follow instructions as told by your health care provider.  If directed, strain all urine through the strainer that was provided by your health care provider. ? Keep all fragments for your health care provider to see. Any stones that are found may be sent to a medical lab for examination. The stone may be as small as a grain of salt.  Keep all follow-up visits as told by your health care provider. This is important. Contact a health care provider if:  You have pain that is severe or does not get better with medicine.  You have nausea that is severe or does not go away.  You have blood in your urine longer than your health care provider told you to expect.  You have more blood in your urine.  You have pain during urination that does   not go away.  You urinate more frequently than usual and this does not go away.  You develop a rash or any other possible signs of an allergic reaction. Get help right away if:  You have severe pain in your back, sides, or upper abdomen.  You have severe pain while urinating.  Your urine is very dark red.  You have blood in your stool (feces).  You cannot pass any urine at all.  You feel a strong urge to urinate after emptying your bladder.  You have a fever or chills.  You develop shortness of breath,  difficulty breathing, or chest pain.  You have severe nausea that leads to persistent vomiting.  You faint. Summary  After this procedure, it is common to have some pain, discomfort, or nausea when pieces (fragments) of the kidney stone move through the tube that carries urine from the kidney to the bladder (ureter). If this pain or nausea is severe, however, you should contact your health care provider.  Most people can resume normal activities 1-2 days after the procedure. Ask your health care provider what activities are safe for you.  Drink enough water and fluids to keep your urine clear or pale yellow. This helps any remaining pieces of the stone to pass, and it can help prevent new stones from forming.  If directed, strain your urine and keep all fragments for your health care provider to see. Fragments or stones may be as small as a grain of salt.  Get help right away if you have severe pain in your back, sides, or upper abdomen or have severe pain while urinating. This information is not intended to replace advice given to you by your health care provider. Make sure you discuss any questions you have with your health care provider. Document Revised: 12/20/2018 Document Reviewed: 07/30/2016 [employer] Patient Education  2020 [employer]  

## 2020-07-23 NOTE — H&P (Signed)
CC/HPI: cc: Right lower quadrant pain, Fatigue, Hisotry of kidney stones   HPI: 74 year old male with past medical history of prostate cancer being treated with brachytherapy, also a past medical history of nephrolithiasis last episode was about 2-3 years ago. He presents today with 2 weeks of right flank discomfort that has radiated into his right lower quadrant associated with intermittent bouts of nausea the pain is described as sharp and intermittent and lasts for few hours at a time but in a. He denies any associated dysuria, gross hematuria, fevers, chills, vomiting. He does endorse extreme fatigue that is unusual for him that began this week.     ALLERGIES: None    MEDICATIONS: Viagra 100 mg tablet 1 tablet PO prn  Allopurinol TABS Oral  Amlodipine Besylate 5 mg tablet  Aspirin 81 MG TABS Oral  Atorvastatin Calcium 10 mg tablet  Bystolic 2.5 mg tablet  Folic Acid  Vitamin Z61  Vitamin D TABS Oral     GU PSH: ESWL - 2018 Locm 300-399Mg /Ml Iodine,1Ml - 2019 Prostate Needle Biopsy - 2020, 2019 TRANSPERI NEEDLE PLACE, PROS - 2020 Transperineal Plmt Biodegradable Matrl 1/Mlt Njx - 2020     NON-GU PSH: Back Surgery (Unspecified) - 2018 Shoulder Surgery (Unspecified) Surgical Pathology, Gross And Microscopic Examination For Prostate Needle - 2020, 2019     GU PMH: ED due to arterial insufficiency (Stable), He is now interested in medical management of his ED -- he was hesitant about this at first due to concerns over his PCa. - 02/01/2020, EGD, he doesn't desire therapy, - 2018 Prostate Cancer, PSA stable. He denies any voiding complaints or any adverse response thus far to radiation therapy. DRE today normal. - 02/01/2020, (Stable), Excellent PSA response thus far. He is having minimal complications/complaints from radiation. , - 07/27/2019 (Improving), Excellent PSA response to brachytherapy (PSA: 0.41 ng/mL) after only 3 months. Reports slight improvement to urinary sx's but is  still dissatisfied with his moderate issues., - 03/23/2019, - 2020, - 2020, - 2020, Prostate cancer with low/low intermediate risk prostate cancer. PSA stable., - 2019, Low/intermediate risk prostate cancer, with 4 cores positive for adenocarcinoma, 3 revealing Gleason 3+3 = 6 pattern, one revealing Gleason 3+4 = 7 pattern. Kattan nomogram predictions: Organ confined disease--47% Seminal vesicle/lymph node involvement--- 3% Progression free probability after radical prostatectomy at 5/10 years--86/76%, respectively IPSS 4 quality of life score 1 SHIM score 3, - 2019 Nocturia - 2020 Benign tumor left kidney - 2019, We need to better evaluate this., - 2019 Elevated PSA - 2019, - 2019, Elevating trend PSA, from 3.7-5.8 in the period of about a year. Greater than 20% free fraction, however. Benign/slightly enlarged prostate on exam today., - 2018 Ureteral calculus - 2019, (Improving), Status post shockwave lithotripsy in November. He has bilateral lower pole stones remaining., - 2019 (Stable, Chronic), Left, Can clearly see left mid ureteral stone at L3. Recommend he proceed with elective ESWL. , - 2018 (Worsening, Chronic), Left, Ketorolac 30 mg IM today. Instructed to resolve constipation with Mag Citrate 1/2 bottle now and if no BM in 4 hrs repeat. May use daily Miralax and RTC in 2 days for KUB. If stone clearly seen will proceed with elective ESWL. If stone not seen may need cystourethroscopy, (L) RPG, stone extraction, and possible double J stent placement. May use PRN NSAID along with Hydrocodone., - 2018, Distal Ureteral Stone On The Right, - 2014 Renal cyst (Stable, Chronic), Left, No blood flow noted. F/U 1 yr w/RUS - 2018 Abdominal Pain  Unspec (Acute), Left, Secondary to ureteral stone - 2018 Dysuria (Acute), Secondary to ureteral stone - 2018 Microscopic hematuria (Acute), Secondary to ureteral stone - 2018 Renal calculus, Kidney stone on right side - 2014      PMH Notes:  1898-09-22 00:00:00 -  Note: Normal Routine History And Physical Adult  2010-10-10 15:27:59 - Note: Urinary Calculus   NON-GU PMH: Personal history of other endocrine, nutritional and metabolic disease, History of hypercholesterolemia - 2014 Gout Hypercholesterolemia Hypertension    FAMILY HISTORY: Death In The Family Father - Father Death In The Family Mother - Mother Heart Disease - Mother Lung Cancer - Father   SOCIAL HISTORY: Marital Status: Married Preferred Language: English; Ethnicity: Not Hispanic Or Latino; Race: White Current Smoking Status: Patient has never smoked.   Tobacco Use Assessment Completed: Used Tobacco in last 30 days? Does not use smokeless tobacco. Social Drinker.  Does not use drugs. Drinks 3 caffeinated drinks per day. Patient's occupation is/was Retired.    REVIEW OF SYSTEMS:    GU Review Male:   Patient denies frequent urination, burning/ pain with urination, get up at night to urinate, leakage of urine, stream starts and stops, trouble starting your stream, have to strain to urinate , erection problems, and penile pain.  Gastrointestinal (Upper):   Patient denies nausea, vomiting, and indigestion/ heartburn.  Gastrointestinal (Lower):   Patient denies diarrhea and constipation.  Constitutional:   Patient denies fever, night sweats, weight loss, and fatigue.  Skin:   Patient denies skin rash/ lesion and itching.  Eyes:   Patient denies blurred vision and double vision.  Ears/ Nose/ Throat:   Patient denies sore throat and sinus problems.  Hematologic/Lymphatic:   Patient denies swollen glands and easy bruising.  Cardiovascular:   Patient denies leg swelling and chest pains.  Respiratory:   Patient denies cough and shortness of breath.  Endocrine:   Patient denies excessive thirst.  Musculoskeletal:   Patient denies back pain and joint pain.  Neurological:   Patient denies headaches and dizziness.  Psychologic:   Patient denies depression and anxiety.   Notes: Right  flank pain.    VITAL SIGNS:      07/13/2020 09:42 AM  BP 148/79 mmHg  Heart Rate 72 /min  Temperature 97.7 F / 36.5 C   MULTI-SYSTEM PHYSICAL EXAMINATION:    Constitutional: Well-nourished. No physical deformities. Normally developed. Good grooming.  Cardiovascular: Normal temperature, normal extremity pulses, no swelling, no varicosities.  Lymphatic: No enlargement of neck, axillae, groin.  Skin: No paleness, no jaundice, no cyanosis. No lesion, no ulcer, no rash.  Neurologic / Psychiatric: Oriented to time, oriented to place, oriented to person. No depression, no anxiety, no agitation.  Musculoskeletal: Normal gait and station of head and neck.     Complexity of Data:  Source Of History:  Patient, Medical Record Summary  Records Review:   Previous Doctor Records, Previous Hospital Records, Previous Patient Records  Urine Test Review:   Urinalysis, Urine Culture   01/25/20 07/20/19 03/16/19 09/08/18 05/19/18 11/30/17 05/22/17 03/03/17  PSA  Total PSA 0.15 ng/mL 0.16 ng/mL 0.41 ng/mL 6.21 ng/mL 5.43 ng/mL 6.21 ng/mL 5.33 ng/mL 5.8 ng/dl  % Free PSA        21.2 %    07/13/20  Urinalysis  Urine Appearance Clear   Urine Color Yellow   Urine Glucose Neg mg/dL  Urine Bilirubin Neg mg/dL  Urine Ketones Neg mg/dL  Urine Specific Gravity 1.025   Urine Blood Trace ery/uL  Urine pH  6.0   Urine Protein Neg mg/dL  Urine Urobilinogen 0.2 mg/dL  Urine Nitrites Neg   Urine Leukocyte Esterase Neg leu/uL  Urine WBC/hpf NS (Not Seen)   Urine RBC/hpf 0 - 2/hpf   Urine Epithelial Cells NS (Not Seen)   Urine Bacteria NS (Not Seen)   Urine Mucous Not Present   Urine Yeast NS (Not Seen)   Urine Trichomonas Not Present   Urine Cystals NS (Not Seen)   Urine Casts NS (Not Seen)   Urine Sperm Not Present    PROCEDURES:         C.T. Urogram - [id number]      Patient confirmed No Neulasta OnPro Device.          KUB - [id number]  A single view of the abdomen is obtained. There is a prominent  overlying bowel gas pattern. There is an opacity that correlates with the CT noted within the right renal shadow and anatomical expected location of the renal pelvis.       Patient confirmed No Neulasta OnPro Device.           Urinalysis w/Scope Dipstick Dipstick Cont'd Micro  Color: Yellow Bilirubin: Neg mg/dL WBC/hpf: NS (Not Seen)  Appearance: Clear Ketones: Neg mg/dL RBC/hpf: 0 - 2/hpf  Specific Gravity: 1.025 Blood: Trace ery/uL Bacteria: NS (Not Seen)  pH: 6.0 Protein: Neg mg/dL Cystals: NS (Not Seen)  Glucose: Neg mg/dL Urobilinogen: 0.2 mg/dL Casts: NS (Not Seen)    Nitrites: Neg Trichomonas: Not Present    Leukocyte Esterase: Neg leu/uL Mucous: Not Present      Epithelial Cells: NS (Not Seen)      Yeast: NS (Not Seen)      Sperm: Not Present    ASSESSMENT:      ICD-10 Details  1 GU:   Renal calculus - N20.0 Right, Acute, Uncomplicated   PLAN:            Medications New Meds: Hydrocodone-Acetaminophen 5 mg-325 mg tablet 1 tablet PO Q 4 H PRN for severe pain  #30  0 Refill(s)  Ondansetron Hcl 4 mg tablet 1 tablet PO Q 6 H PRN   #30  0 Refill(s)            Orders Labs CULTURE, URINE  X-Rays: C.T. Stone Protocol Without Contrast    KUB  X-Ray Notes: History:  Hematuria: Yes/No  Patient to see MD after exam: Yes/No  Previous exam: CT / IVP/ US/ KUB/ None  When:  Where:  Diabetic: Yes/ No  BUN/ Creatine:  Date of last BUN Creatinine:  Weight in pounds:  Allergy- Contrasts/ Shellfish: Yes/ No  Conflicting diabetic meds: Yes/ No  Oral contrast and instructions given to patient:   Prior Authorization #: [id number]            Schedule Return Visit/Planned Activity: Next Available Appointment - Schedule Surgery          Document Letter(s):  Created for Patient: Clinical Summary         Notes:   Urine sent for precautionary culture. Ct scan concerning for 91mm renal pelvic stone with some mild perinephric stranding. Advised KUB to see if ESWL would  be an option. This did shhow prominent bowel gas, however, opacity is noted and correlates with CT within the right renal shadow. Sent prescription for pain and nausea medications if needed. ESWL sheet given to scheduler. Advised strict return to clinic precautions for worsening symptomatology. He verbalized understanding.;  Next Appointment:      Next Appointment: 08/01/2020 10:00 AM    Appointment Type: Laboratory Appointment    Location: [facility] [phone]    Provider: Lab LAB    Reason for Visit: 6 mo psa

## 2020-07-23 NOTE — Interval H&P Note (Signed)
History and Physical Interval Note: No change in stone.  07/23/2020 10:10 AM  Gregory Reid.  has presented today for surgery, with the diagnosis of RIGHT PROXIMAL CALCULI.  The various methods of treatment have been discussed with the patient and family. After consideration of risks, benefits and other options for treatment, the patient has consented to  Procedure(s): EXTRACORPOREAL SHOCK WAVE LITHOTRIPSY (ESWL) (Right) as a surgical intervention.  The patient's history has been reviewed, patient examined, no change in status, stable for surgery.  I have reviewed the patient's chart and labs.  Questions were answered to the patient's satisfaction.     Irine Seal

## 2020-07-24 ENCOUNTER — Encounter (HOSPITAL_BASED_OUTPATIENT_CLINIC_OR_DEPARTMENT_OTHER): Payer: Self-pay | Admitting: Urology

## 2020-08-01 DIAGNOSIS — N200 Calculus of kidney: Secondary | ICD-10-CM | POA: Diagnosis not present

## 2020-08-08 DIAGNOSIS — N200 Calculus of kidney: Secondary | ICD-10-CM | POA: Diagnosis not present

## 2020-09-05 DIAGNOSIS — N200 Calculus of kidney: Secondary | ICD-10-CM | POA: Diagnosis not present

## 2020-09-25 DIAGNOSIS — I10 Essential (primary) hypertension: Secondary | ICD-10-CM | POA: Diagnosis not present

## 2020-09-25 DIAGNOSIS — E7800 Pure hypercholesterolemia, unspecified: Secondary | ICD-10-CM | POA: Diagnosis not present

## 2020-10-02 DIAGNOSIS — E7800 Pure hypercholesterolemia, unspecified: Secondary | ICD-10-CM | POA: Diagnosis not present

## 2020-10-02 DIAGNOSIS — I10 Essential (primary) hypertension: Secondary | ICD-10-CM | POA: Diagnosis not present

## 2020-10-02 DIAGNOSIS — Z0000 Encounter for general adult medical examination without abnormal findings: Secondary | ICD-10-CM | POA: Diagnosis not present

## 2020-10-10 DIAGNOSIS — L570 Actinic keratosis: Secondary | ICD-10-CM | POA: Diagnosis not present

## 2020-10-10 DIAGNOSIS — D225 Melanocytic nevi of trunk: Secondary | ICD-10-CM | POA: Diagnosis not present

## 2020-10-10 DIAGNOSIS — L821 Other seborrheic keratosis: Secondary | ICD-10-CM | POA: Diagnosis not present

## 2020-10-10 DIAGNOSIS — L814 Other melanin hyperpigmentation: Secondary | ICD-10-CM | POA: Diagnosis not present

## 2020-10-10 DIAGNOSIS — D485 Neoplasm of uncertain behavior of skin: Secondary | ICD-10-CM | POA: Diagnosis not present

## 2020-10-10 DIAGNOSIS — D1801 Hemangioma of skin and subcutaneous tissue: Secondary | ICD-10-CM | POA: Diagnosis not present

## 2020-10-10 DIAGNOSIS — D2271 Melanocytic nevi of right lower limb, including hip: Secondary | ICD-10-CM | POA: Diagnosis not present

## 2020-10-10 DIAGNOSIS — D2262 Melanocytic nevi of left upper limb, including shoulder: Secondary | ICD-10-CM | POA: Diagnosis not present

## 2021-02-11 DIAGNOSIS — J4531 Mild persistent asthma with (acute) exacerbation: Secondary | ICD-10-CM | POA: Diagnosis not present

## 2021-02-20 DIAGNOSIS — Z8616 Personal history of COVID-19: Secondary | ICD-10-CM

## 2021-02-20 HISTORY — DX: Personal history of COVID-19: Z86.16

## 2021-03-28 DIAGNOSIS — E7800 Pure hypercholesterolemia, unspecified: Secondary | ICD-10-CM | POA: Diagnosis not present

## 2021-04-03 DIAGNOSIS — I10 Essential (primary) hypertension: Secondary | ICD-10-CM | POA: Diagnosis not present

## 2021-04-03 DIAGNOSIS — E559 Vitamin D deficiency, unspecified: Secondary | ICD-10-CM | POA: Diagnosis not present

## 2021-04-03 DIAGNOSIS — E7800 Pure hypercholesterolemia, unspecified: Secondary | ICD-10-CM | POA: Diagnosis not present

## 2021-05-01 DIAGNOSIS — I10 Essential (primary) hypertension: Secondary | ICD-10-CM | POA: Diagnosis not present

## 2021-05-17 DIAGNOSIS — R310 Gross hematuria: Secondary | ICD-10-CM | POA: Diagnosis not present

## 2021-06-14 DIAGNOSIS — Z23 Encounter for immunization: Secondary | ICD-10-CM | POA: Diagnosis not present

## 2021-07-15 DIAGNOSIS — Z87442 Personal history of urinary calculi: Secondary | ICD-10-CM | POA: Diagnosis not present

## 2021-07-15 DIAGNOSIS — R310 Gross hematuria: Secondary | ICD-10-CM | POA: Diagnosis not present

## 2021-07-17 DIAGNOSIS — H2513 Age-related nuclear cataract, bilateral: Secondary | ICD-10-CM | POA: Diagnosis not present

## 2021-07-17 DIAGNOSIS — H52203 Unspecified astigmatism, bilateral: Secondary | ICD-10-CM | POA: Diagnosis not present

## 2021-07-17 DIAGNOSIS — H353131 Nonexudative age-related macular degeneration, bilateral, early dry stage: Secondary | ICD-10-CM | POA: Diagnosis not present

## 2022-02-25 DIAGNOSIS — N201 Calculus of ureter: Secondary | ICD-10-CM | POA: Diagnosis not present

## 2022-02-25 DIAGNOSIS — I700 Atherosclerosis of aorta: Secondary | ICD-10-CM | POA: Diagnosis not present

## 2022-02-25 DIAGNOSIS — N1330 Unspecified hydronephrosis: Secondary | ICD-10-CM | POA: Diagnosis not present

## 2022-02-25 DIAGNOSIS — N200 Calculus of kidney: Secondary | ICD-10-CM | POA: Diagnosis not present

## 2022-02-26 ENCOUNTER — Other Ambulatory Visit: Payer: Self-pay | Admitting: Urology

## 2022-03-04 ENCOUNTER — Encounter (HOSPITAL_BASED_OUTPATIENT_CLINIC_OR_DEPARTMENT_OTHER): Payer: Self-pay | Admitting: Urology

## 2022-03-04 ENCOUNTER — Other Ambulatory Visit: Payer: Self-pay

## 2022-03-04 NOTE — Progress Notes (Signed)
Spoke w/ via phone for pre-op interview---pt Lab needs dos----    istat, ekg           Lab results------none COVID test -----patient states asymptomatic no test needed Arrive at -------745 am 03-10-2022 NPO after MN NO Solid Food.  Clear liquids from MN until---645 am Med rec completed Medications to take morning of surgery -----atorvastatin, allopurinol, bystolic, amlodipine, tamsulosin Diabetic medication -----n/a Patient instructed no nail polish to be worn day of surgery Patient instructed to bring photo id and insurance card day of surgery Patient aware to have Driver (ride ) / caregiver  [name] will stay  for 24 hours after surgery  Patient Special Instructions -----none Pre-Op special Istructions -----none Patient verbalized understanding of instructions that were given at this phone interview. Patient denies shortness of breath, chest pain, fever, cough at this phone interview.

## 2022-03-10 ENCOUNTER — Ambulatory Visit (HOSPITAL_BASED_OUTPATIENT_CLINIC_OR_DEPARTMENT_OTHER)
Admission: RE | Admit: 2022-03-10 | Discharge: 2022-03-10 | Disposition: A | Discharge status: Home / Self Care | Payer: Medicare Other | Attending: Urology | Admitting: Urology

## 2022-03-10 ENCOUNTER — Other Ambulatory Visit: Payer: Self-pay

## 2022-03-10 ENCOUNTER — Ambulatory Visit (HOSPITAL_BASED_OUTPATIENT_CLINIC_OR_DEPARTMENT_OTHER): Payer: Medicare Other | Admitting: Anesthesiology

## 2022-03-10 ENCOUNTER — Encounter (HOSPITAL_BASED_OUTPATIENT_CLINIC_OR_DEPARTMENT_OTHER): Payer: Self-pay | Admitting: Urology

## 2022-03-10 ENCOUNTER — Encounter (HOSPITAL_BASED_OUTPATIENT_CLINIC_OR_DEPARTMENT_OTHER)
Admission: RE | Disposition: A | Discharge status: Home / Self Care | Payer: Self-pay | Source: Home / Self Care | Attending: Urology

## 2022-03-10 ENCOUNTER — Ambulatory Visit (HOSPITAL_COMMUNITY): Payer: Medicare Other

## 2022-03-10 DIAGNOSIS — Z8546 Personal history of malignant neoplasm of prostate: Secondary | ICD-10-CM | POA: Insufficient documentation

## 2022-03-10 DIAGNOSIS — N201 Calculus of ureter: Secondary | ICD-10-CM

## 2022-03-10 DIAGNOSIS — E669 Obesity, unspecified: Secondary | ICD-10-CM | POA: Insufficient documentation

## 2022-03-10 DIAGNOSIS — N200 Calculus of kidney: Secondary | ICD-10-CM | POA: Diagnosis not present

## 2022-03-10 DIAGNOSIS — Z6831 Body mass index (BMI) 31.0-31.9, adult: Secondary | ICD-10-CM | POA: Diagnosis not present

## 2022-03-10 DIAGNOSIS — Z01818 Encounter for other preprocedural examination: Secondary | ICD-10-CM

## 2022-03-10 DIAGNOSIS — I10 Essential (primary) hypertension: Secondary | ICD-10-CM | POA: Diagnosis not present

## 2022-03-10 DIAGNOSIS — Z79899 Other long term (current) drug therapy: Secondary | ICD-10-CM | POA: Diagnosis not present

## 2022-03-10 HISTORY — PX: CYSTOSCOPY WITH RETROGRADE PYELOGRAM, URETEROSCOPY AND STENT PLACEMENT: SHX5789

## 2022-03-10 LAB — POCT I-STAT, CHEM 8
BUN: 27 mg/dL — ABNORMAL HIGH (ref 8–23)
Calcium, Ion: 1.3 mmol/L (ref 1.15–1.40)
Chloride: 105 mmol/L (ref 98–111)
Creatinine, Ser: 1.3 mg/dL — ABNORMAL HIGH (ref 0.61–1.24)
Glucose, Bld: 101 mg/dL — ABNORMAL HIGH (ref 70–99)
HCT: 42 % (ref 39.0–52.0)
Hemoglobin: 14.3 g/dL (ref 13.0–17.0)
Potassium: 4.7 mmol/L (ref 3.5–5.1)
Sodium: 143 mmol/L (ref 135–145)
TCO2: 27 mmol/L (ref 22–32)

## 2022-03-10 SURGERY — CYSTOURETEROSCOPY, WITH RETROGRADE PYELOGRAM AND STENT INSERTION
Anesthesia: General | Site: Ureter | Laterality: Left

## 2022-03-10 MED ORDER — MIDAZOLAM HCL 2 MG/2ML IJ SOLN
INTRAMUSCULAR | Status: AC
  Filled 2022-03-10: qty 2

## 2022-03-10 MED ORDER — LIDOCAINE 2% (20 MG/ML) 5 ML SYRINGE
INTRAMUSCULAR | Status: DC | PRN
  Administered 2022-03-10: 40 mg via INTRAVENOUS

## 2022-03-10 MED ORDER — DEXAMETHASONE SODIUM PHOSPHATE 10 MG/ML IJ SOLN
INTRAMUSCULAR | Status: DC | PRN
  Administered 2022-03-10: 5 mg via INTRAVENOUS

## 2022-03-10 MED ORDER — 0.9 % SODIUM CHLORIDE (POUR BTL) OPTIME
TOPICAL | Status: DC | PRN
  Administered 2022-03-10: 500 mL

## 2022-03-10 MED ORDER — LACTATED RINGERS IV SOLN: INTRAVENOUS | Status: DC

## 2022-03-10 MED ORDER — SODIUM CHLORIDE 0.9 % IR SOLN
Status: DC | PRN
  Administered 2022-03-10: 3000 mL

## 2022-03-10 MED ORDER — OXYCODONE HCL 5 MG PO TABS: 5.0000 mg | ORAL_TABLET | Freq: Once | ORAL | Status: DC | PRN

## 2022-03-10 MED ORDER — OXYCODONE HCL 5 MG/5ML PO SOLN: 5.0000 mg | Freq: Once | ORAL | Status: DC | PRN

## 2022-03-10 MED ORDER — ACETAMINOPHEN 500 MG PO TABS
ORAL_TABLET | ORAL | Status: AC
  Filled 2022-03-10: qty 2

## 2022-03-10 MED ORDER — PROPOFOL 10 MG/ML IV BOLUS
INTRAVENOUS | Status: AC
  Filled 2022-03-10: qty 20

## 2022-03-10 MED ORDER — MEPERIDINE HCL 25 MG/ML IJ SOLN: 6.2500 mg | INTRAMUSCULAR | Status: DC | PRN

## 2022-03-10 MED ORDER — MIDAZOLAM HCL 2 MG/2ML IJ SOLN
INTRAMUSCULAR | Status: DC | PRN
  Administered 2022-03-10: 1 mg via INTRAVENOUS

## 2022-03-10 MED ORDER — FENTANYL CITRATE (PF) 100 MCG/2ML IJ SOLN
INTRAMUSCULAR | Status: AC
  Filled 2022-03-10: qty 2

## 2022-03-10 MED ORDER — CEFAZOLIN SODIUM-DEXTROSE 2-4 GM/100ML-% IV SOLN
2.0000 g | INTRAVENOUS | Status: AC
  Administered 2022-03-10: 2 g via INTRAVENOUS

## 2022-03-10 MED ORDER — ACETAMINOPHEN 500 MG PO TABS
1000.0000 mg | ORAL_TABLET | Freq: Once | ORAL | Status: AC
Start: 2022-03-10 — End: 2022-03-10
  Administered 2022-03-10: 1000 mg via ORAL

## 2022-03-10 MED ORDER — FENTANYL CITRATE (PF) 250 MCG/5ML IJ SOLN
INTRAMUSCULAR | Status: DC | PRN
  Administered 2022-03-10: 25 ug via INTRAVENOUS
  Administered 2022-03-10: 50 ug via INTRAVENOUS

## 2022-03-10 MED ORDER — MIDAZOLAM HCL 2 MG/2ML IJ SOLN: 0.5000 mg | Freq: Once | INTRAMUSCULAR | Status: DC | PRN

## 2022-03-10 MED ORDER — CEPHALEXIN 500 MG PO CAPS: 500.0000 mg | ORAL_CAPSULE | Freq: Two times a day (BID) | ORAL | 0 refills | Status: AC

## 2022-03-10 MED ORDER — FENTANYL CITRATE (PF) 100 MCG/2ML IJ SOLN: 25.0000 ug | INTRAMUSCULAR | Status: DC | PRN

## 2022-03-10 MED ORDER — PROPOFOL 10 MG/ML IV BOLUS
INTRAVENOUS | Status: DC | PRN
  Administered 2022-03-10: 200 mg via INTRAVENOUS

## 2022-03-10 MED ORDER — IOHEXOL 300 MG/ML  SOLN
INTRAMUSCULAR | Status: DC | PRN
  Administered 2022-03-10: 5 mL via URETHRAL

## 2022-03-10 MED ORDER — CEFAZOLIN SODIUM-DEXTROSE 2-4 GM/100ML-% IV SOLN
INTRAVENOUS | Status: AC
  Filled 2022-03-10: qty 100

## 2022-03-10 MED ORDER — ONDANSETRON HCL 4 MG/2ML IJ SOLN
INTRAMUSCULAR | Status: DC | PRN
  Administered 2022-03-10: 4 mg via INTRAVENOUS

## 2022-03-10 SURGICAL SUPPLY — 22 items
BAG DRAIN URO-CYSTO SKYTR STRL (DRAIN) ×3 IMPLANT
BAG DRN UROCATH (DRAIN) ×2
BASKET STONE 1.7 NGAGE (UROLOGICAL SUPPLIES) ×2 IMPLANT
CATH INTERMIT  6FR 70CM (CATHETERS) ×3 IMPLANT
CLOTH BEACON ORANGE TIMEOUT ST (SAFETY) ×3 IMPLANT
COVER DOME SNAP 22 D (MISCELLANEOUS) ×3 IMPLANT
DRSG TEGADERM 2-3/8X2-3/4 SM (GAUZE/BANDAGES/DRESSINGS) ×2 IMPLANT
GLOVE BIO SURGEON STRL SZ 6.5 (GLOVE) ×4 IMPLANT
GLOVE BIO SURGEON STRL SZ8 (GLOVE) ×3 IMPLANT
GOWN STRL REUS W/ TWL LRG LVL3 (GOWN DISPOSABLE) ×1 IMPLANT
GOWN STRL REUS W/TWL LRG LVL3 (GOWN DISPOSABLE) ×3
GOWN STRL REUS W/TWL XL LVL3 (GOWN DISPOSABLE) ×3 IMPLANT
GUIDEWIRE ANG ZIPWIRE 038X150 (WIRE) IMPLANT
GUIDEWIRE STR DUAL SENSOR (WIRE) ×2 IMPLANT
IV NS IRRIG 3000ML ARTHROMATIC (IV SOLUTION) ×4 IMPLANT
KIT TURNOVER CYSTO (KITS) ×3 IMPLANT
MANIFOLD NEPTUNE II (INSTRUMENTS) ×3 IMPLANT
NS IRRIG 500ML POUR BTL (IV SOLUTION) ×3 IMPLANT
PACK CYSTO (CUSTOM PROCEDURE TRAY) ×3 IMPLANT
STENT URET 6FRX26 CONTOUR (STENTS) ×2 IMPLANT
TUBE CONNECTING 12X1/4 (SUCTIONS) ×2 IMPLANT
TUBING UROLOGY SET (TUBING) ×2 IMPLANT

## 2022-03-10 NOTE — Anesthesia Procedure Notes (Signed)
Procedure Name: LMA Insertion Date/Time: 03/10/2022 9:57 AM  Performed by: Clearnce Sorrel, CRNAPre-anesthesia Checklist: Patient identified, Emergency Drugs available, Suction available and Patient being monitored Patient Re-evaluated:Patient Re-evaluated prior to induction Oxygen Delivery Method: Circle System Utilized Preoxygenation: Pre-oxygenation with 100% oxygen Induction Type: IV induction Ventilation: Mask ventilation without difficulty LMA: LMA inserted LMA Size: 4.0 Number of attempts: 1 Airway Equipment and Method: Bite block Placement Confirmation: positive ETCO2 Tube secured with: Tape Dental Injury: Teeth and Oropharynx as per pre-operative assessment

## 2022-03-10 NOTE — Anesthesia Preprocedure Evaluation (Addendum)
Anesthesia Evaluation  Patient identified by MRN, date of birth, ID band Patient awake    Reviewed: Allergy & Precautions, NPO status , Patient's Chart, lab work & pertinent test results, reviewed documented beta blocker date and time   History of Anesthesia Complications Negative for: history of anesthetic complications  Airway Mallampati: I  TM Distance: >3 FB Neck ROM: Full    Dental  (+) Dental Advisory Given, Teeth Intact   Pulmonary neg pulmonary ROS,    breath sounds clear to auscultation       Cardiovascular hypertension, Pt. on medications and Pt. on home beta blockers (-) angina+ CAD (calcifications seen on cardiac CT: stress test normal)   Rhythm:Regular Rate:Normal  '15 Stress: Normal stress nuclear study. LV EF: 64%.  LV Wall Motion:  NL LV Function; NL Wall Motion   Neuro/Psych negative neurological ROS     GI/Hepatic negative GI ROS, Neg liver ROS,   Endo/Other  obese  Renal/GU stones   Prostate cancer    Musculoskeletal   Abdominal (+) + obese,   Peds  Hematology negative hematology ROS (+)   Anesthesia Other Findings   Reproductive/Obstetrics                            Anesthesia Physical Anesthesia Plan  ASA: 2  Anesthesia Plan: General   Post-op Pain Management: Tylenol PO (pre-op)*   Induction: Intravenous  PONV Risk Score and Plan: 2 and Ondansetron and Dexamethasone  Airway Management Planned: LMA  Additional Equipment: None  Intra-op Plan:   Post-operative Plan:   Informed Consent: I have reviewed the patients History and Physical, chart, labs and discussed the procedure including the risks, benefits and alternatives for the proposed anesthesia with the patient or authorized representative who has indicated his/her understanding and acceptance.     Dental advisory given  Plan Discussed with: CRNA and Surgeon  Anesthesia Plan Comments:         Anesthesia Quick Evaluation

## 2022-03-10 NOTE — H&P (Signed)
H&P  Chief Complaint: Kidney stone  History of Present Illness: 76 year old male presents the time for ureteroscopic management of a persistently symptomatic, not progressing left distal ureteral stone.  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)    Hypertension    Nephrolithiasis    Prostate cancer [facility]) urologist-  dr Stirling Orton/  oncologist- dr Tammi Klippel   dx 05/ 2019--- Stage T1c,  Gleason 3+4   Wears glasses     Past Surgical History:  Procedure Laterality Date   colonscopy  10/14/2018   CYSTOSCOPY N/A 12/22/2018   Procedure: CYSTOSCOPY;  Surgeon: Franchot Gallo, MD;  Location: [facility];  Service: Urology;  Laterality: N/A;   EXTRACORPOREAL SHOCK WAVE LITHOTRIPSY Left 07/27/2017   Procedure: LEFT EXTRACORPOREAL SHOCK WAVE LITHOTRIPSY (ESWL);  Surgeon: Festus Aloe, MD;  Location: [facility];  Service: Urology;  Laterality: Left;   EXTRACORPOREAL SHOCK WAVE LITHOTRIPSY Right 07/23/2020   Procedure: EXTRACORPOREAL SHOCK WAVE LITHOTRIPSY (ESWL);  Surgeon: Irine Seal, MD;  Location[facility];  Service: Urology;  Laterality: Right;   FOOT SURGERY  1960s   removal foreign body,  per unsure which foot   LUMBAR LAMINECTOMY/DECOMPRESSION MICRODISCECTOMY Left 02/11/2017   Procedure: LEFT LUMBAR FOUR- [place],  MICRODISCECTOMY;  Surgeon: Jovita Gamma, MD;  Location: [place];  Service: Neurosurgery;  Laterality: Left;   RADIOACTIVE SEED IMPLANT N/A 12/22/2018   Procedure: RADIOACTIVE SEED IMPLANT/BRACHYTHERAPY IMPLANT;  Surgeon: Franchot Gallo, MD;  Location: [facility];  Service: Urology;  Laterality: N/A;   SHOULDER ARTHROSCOPY Left 2009   SPACE OAR INSTILLATION N/A 12/22/2018   Procedure: SPACE OAR INSTILLATION;  Surgeon: Franchot Gallo, MD;  Location: [facility];  Service:  Urology;  Laterality: N/A;    Home Medications:    Allergies: No Known Allergies  Family History  Problem Relation Age of Onset   Lung cancer Father 57       smoked approximately 40-45 years then stopped   Breast cancer Sister    Prostate cancer Maternal Grandfather    Pancreatic cancer Neg Hx    Colon cancer Neg Hx     Social History:  reports that he has never smoked. He has never used smokeless tobacco. He reports current alcohol use. He reports that he does not use drugs.  ROS: A complete review of systems was performed.  All systems are negative except for pertinent findings as noted.  Physical Exam:  Vital signs in last 24 hours: BP (!) 164/76   Pulse 77   Temp (!) 97.3 F (36.3 C) (Oral)   Resp 16   Ht '5\' 10"'$  (1.778 m)   Wt 99.8 kg   SpO2 100%   BMI 31.57 kg/m  Constitutional:  Alert and oriented, No acute distress Cardiovascular: Regular rate  Respiratory: Normal respiratory effort Neurologic: Grossly intact, no focal deficits Psychiatric: Normal mood and affect  I have reviewed prior pt notes  I have reviewed urinalysis results  I have independently reviewed prior imaging--most recent CT scan and KUB today confirmed left distal ureteral stone    Impression/Assessment:  Left distal ureteral stone  Plan:  Cystoscopy, left retrograde, left ureteroscopy, possible holmium laser/extraction of stone with double-J stent placement

## 2022-03-10 NOTE — Op Note (Signed)
Preoperative diagnosis: Left distal ureteral stone  Postoperative diagnosis: Same  Principal procedure: Cystoscopy, left retrograde ureteropyelogram, use of fluoroscopy for less than 1 hour, left ureteroscopy, extraction of left distal ureteral stone, placement of 6 French by 26 cm contour double-J stent with tether  Surgeon: Nathyn Luiz  Anesthesia: General with LMA  Complications: None  Specimen: Stone  Estimated blood loss: None  Indications: 76 year old male known to me for prior history of prostate cancer.  He has had brachytherapy.  Recent presentation for gross painless hematuria.  That was several weeks ago.  Evaluation included CT scan revealing a left distal ureteral stone.  There has been on passage of the stone with conservative/medical expulsive therapy.  He presents at this time for removal of the stone.  I discussed the procedure, risk, complications and expected outcome with the patient.  He understands and desires to proceed.  Findings: Urethra was normal.  Prostate nonobstructive.  Urothelium of the bladder was normal.  Ureteral orifices were normal in their location and configuration.  Retrograde study of the left ureter was performed with a 6 [place] open-ended catheter and Omnipaque.  This revealed a small filling defect distally, no proximal hydroureteronephrosis or other filling defects.  Pyelocalyceal system normal.  Description of procedure: The patient was properly identified and marked in the holding area.  Is taken to the operating room where general anesthetic was administered with the LMA.  He is placed in the dorsolithotomy position.  Genitalia and perineum were prepped, draped, proper timeout performed.  21 French panendoscope advanced into the bladder.  Above-mentioned findings noted.  Retrograde study of the left ureter was performed using a 6 [place] open-ended catheter and Omnipaque.  There was an evident filling defect consistent with the stone.  I then  negotiated a sensor tip guidewire through the open-ended catheter, once it was positioned appropriately in the ureter, the cystoscope and open-ended catheter were removed.  I then passed a 6 French dual-lumen semirigid ureteroscope under direct vision easily into the left distal ureter.  The stone was identified.  I ran the scope all the way up to the ureteropelvic junction, no further stones were seen.  This also passively dilated the ureteral orifice.  I backed the scope down to the stone, grasped it with an engage basket and extracted it.  It was saved for specimen.  Ureteroscope was removed.  I then backloaded the guidewire through the cystoscope, and under fluoroscopic guidance passed a 26 cm x 6 French Contour double-J stent.  Once adequately positioned it was deployed, the guidewire and scope were removed after the bladder was drained.  The tether was left intact.  It was brought through the penis, tied at the meatus, trimmed and taped to the penis.  At this time the procedure was terminated.  The patient was awakened, taken to the PACU in stable condition, having tolerated the procedure well.

## 2022-03-10 NOTE — Discharge Instructions (Addendum)
You may see some blood in the urine and may have some burning with urination for 48-72 hours. You also may notice that you have to urinate more frequently or urgently after your procedure which is normal.  You should call should you develop an inability urinate, fever > 101, persistent nausea and vomiting that prevents you from eating or drinking to stay hydrated.  If you have a stent, you will likely urinate more frequently and urgently until the stent is removed and you may experience some discomfort/pain in the lower abdomen and flank especially when urinating. You may take pain medication prescribed to you if needed for pain. You may also intermittently have blood in the urine until the stent is removed.  Pull the thread to remove the stent on Wednesday morning.  [facility] [phone] Post Ureteroscopy With or Without Stent Instructions  Definitions:  Ureter: The duct that transports urine from the kidney to the bladder. Stent:   A plastic hollow tube that is placed into the ureter, from the kidney to the bladder to prevent the ureter from swelling shut.  GENERAL INSTRUCTIONS:  Despite the fact that no skin incisions were used, the area around the ureter and bladder is raw and irritated. The stent is a foreign body which will further irritate the bladder wall. This irritation is manifested by increased frequency of urination, both day and night, and by an increase in the urge to urinate. In some, the urge to urinate is present almost always. Sometimes the urge is strong enough that you may not be able to stop yourself from urinating. The only real cure is to remove the stent and then give time for the bladder wall to heal which can't be done until the danger of the ureter swelling shut has passed, which varies.  You may see some blood in your urine while the stent is in place and a few days afterwards. Do not be alarmed, even if the urine was clear for a while. Get off your  feet and drink lots of fluids until clearing occurs. If you start to pass clots or don't improve, call us.  DIET: You may return to your normal diet immediately. Because of the raw surface of your bladder, alcohol, spicy foods, acid type foods and drinks with caffeine may cause irritation or frequency and should be used in moderation. To keep your urine flowing freely and to avoid constipation, drink plenty of fluids during the day ( 8-10 glasses ). Tip: Avoid cranberry juice because it is very acidic.  ACTIVITY: Your physical activity doesn't need to be restricted. However, if you are very active, you may see some blood in your urine. We suggest that you reduce your activity under these circumstances until the bleeding has stopped.  BOWELS: It is important to keep your bowels regular during the postoperative period. Straining with bowel movements can cause bleeding. A bowel movement every other day is reasonable. Use a mild laxative if needed, such as Milk of Magnesia 2-3 tablespoons, or 2 Dulcolax tablets. Call if you continue to have problems. If you have been taking narcotics for pain, before, during or after your surgery, you may be constipated. Take a laxative if necessary.   MEDICATION: You should resume your pre-surgery medications unless told not to. In addition you will often be given an antibiotic to prevent infection. These should be taken as prescribed until the bottles are finished unless you are having an unusual reaction to one of the drugs.  PROBLEMS  YOU SHOULD REPORT TO [place]: Fevers over 100.5 Fahrenheit. Heavy bleeding, or clots ( See above notes about blood in urine ). Inability to urinate. Drug reactions ( hives, rash, nausea, vomiting, diarrhea ). Severe burning or pain with urination that is not improving.  FOLLOW-UP: You will need a follow-up appointment to monitor your progress. Call for this appointment at the number listed above. Usually the first appointment will be  about three to fourteen days after your surgery.      Post Anesthesia Home Care Instructions  Activity: Get plenty of rest for the remainder of the day. A responsible individual must stay with you for 24 hours following the procedure.  For the next 24 hours, DO NOT: -Drive a car -Paediatric nurse -Drink alcoholic beverages -Take any medication unless instructed by your physician -Make any legal decisions or sign important papers.  Meals: Start with liquid foods such as gelatin or soup. Progress to regular foods as tolerated. Avoid greasy, spicy, heavy foods. If nausea and/or vomiting occur, drink only clear liquids until the nausea and/or vomiting subsides. Call your physician if vomiting continues.  Special Instructions/Symptoms: Your throat may feel dry or sore from the anesthesia or the breathing tube placed in your throat during surgery. If this causes discomfort, gargle with warm salt water. The discomfort should disappear within 24 hours.  No acetaminophen/Tylenol until after 2:15 pm today if needed.

## 2022-03-10 NOTE — Anesthesia Postprocedure Evaluation (Signed)
Anesthesia Post Note  Patient: Gregory Reid.  Procedure(s) Performed: CYSTOSCOPY WITH RETROGRADE PYELOGRAM, URETEROSCOPY, STONE BASKETTING AND STENT PLACEMENT (Left: Ureter)     Patient location during evaluation: PACU Anesthesia Type: General Level of consciousness: awake and alert, patient cooperative and oriented Pain management: pain level controlled Vital Signs Assessment: post-procedure vital signs reviewed and stable Respiratory status: spontaneous breathing, nonlabored ventilation and respiratory function stable Cardiovascular status: blood pressure returned to baseline and stable Postop Assessment: no apparent nausea or vomiting Anesthetic complications: no   No notable events documented.  Last Vitals:  Vitals:   03/10/22 1033 03/10/22 1045  BP: (!) 114/58 115/72  Pulse: 64 62  Resp: 12 14  Temp: (!) 36.3 C   SpO2: 96% 95%    Last Pain:  Vitals:   03/10/22 1045  TempSrc:   PainSc: 0-No pain                 Shahara Hartsfield,E. Ron Beske

## 2022-03-10 NOTE — Transfer of Care (Signed)
Immediate Anesthesia Transfer of Care Note  Patient: Gregory Reid.  Procedure(s) Performed: CYSTOSCOPY WITH RETROGRADE PYELOGRAM, URETEROSCOPY, STONE BASKETTING AND STENT PLACEMENT (Left: Ureter)  Patient Location: PACU  Anesthesia Type:General  Level of Consciousness: awake, alert  and oriented  Airway & Oxygen Therapy: Patient Spontanous Breathing  Post-op Assessment: Report given to RN and Post -op Vital signs reviewed and stable  Post vital signs: Reviewed and stable  Last Vitals:  Vitals Value Taken Time  BP 114/58 03/10/22 1033  Temp    Pulse 61 03/10/22 1033  Resp 12 03/10/22 1033  SpO2 95 % 03/10/22 1033  Vitals shown include unvalidated device data.  Last Pain:  Vitals:   03/10/22 0805  TempSrc: Oral  PainSc: 0-No pain      Patients Stated Pain Goal: 4 (73/56/70 1410)  Complications: No notable events documented.

## 2022-03-12 ENCOUNTER — Encounter (HOSPITAL_BASED_OUTPATIENT_CLINIC_OR_DEPARTMENT_OTHER): Payer: Self-pay | Admitting: Urology

## 2022-04-01 DIAGNOSIS — E7800 Pure hypercholesterolemia, unspecified: Secondary | ICD-10-CM | POA: Diagnosis not present

## 2022-04-07 DIAGNOSIS — N202 Calculus of kidney with calculus of ureter: Secondary | ICD-10-CM | POA: Diagnosis not present

## 2022-04-08 DIAGNOSIS — E559 Vitamin D deficiency, unspecified: Secondary | ICD-10-CM | POA: Diagnosis not present

## 2022-04-08 DIAGNOSIS — I10 Essential (primary) hypertension: Secondary | ICD-10-CM | POA: Diagnosis not present

## 2022-04-08 DIAGNOSIS — E7800 Pure hypercholesterolemia, unspecified: Secondary | ICD-10-CM | POA: Diagnosis not present

## 2022-04-09 ENCOUNTER — Encounter: Payer: Self-pay | Admitting: Student

## 2022-04-09 ENCOUNTER — Ambulatory Visit: Payer: Medicare Other | Admitting: Student

## 2022-04-09 VITALS — BP 149/76 | HR 82 | Temp 98.8°F | Resp 17 | Ht 70.0 in | Wt 222.2 lb

## 2022-04-09 DIAGNOSIS — I480 Paroxysmal atrial fibrillation: Secondary | ICD-10-CM | POA: Diagnosis not present

## 2022-04-09 DIAGNOSIS — R9431 Abnormal electrocardiogram [ECG] [EKG]: Secondary | ICD-10-CM | POA: Diagnosis not present

## 2022-04-09 DIAGNOSIS — I10 Essential (primary) hypertension: Secondary | ICD-10-CM

## 2022-04-09 DIAGNOSIS — R0989 Other specified symptoms and signs involving the circulatory and respiratory systems: Secondary | ICD-10-CM

## 2022-04-09 MED ORDER — APIXABAN 5 MG PO TABS: 5.0000 mg | ORAL_TABLET | Freq: Two times a day (BID) | ORAL | 3 refills | Status: AC

## 2022-04-09 NOTE — Progress Notes (Signed)
Primary Physician/Referring:  Holland Commons, FNP  Patient ID: Gregory Filbert., male    DOB: 05/22/1946, 76 y.o.   MRN: [id number]  No chief complaint on file.  HPI:    Gregory Burnham.  is a 76 y.o. Caucasian male with history of hypertension, hyperlipidemia.  Denies history of tobacco use, alcohol use, and diabetes.  Patient is referred urgently to our office by PCP as patient was seen in PCP office yesterday for hypertension follow-up and EKG found him to be in new onset atrial fibrillation with controlled ventricular response.  Given CHA2DS2-VASc score of 2 patient was started on Eliquis by PCP and referred to our office for further evaluation and management of both atrial fibrillation as well as labile blood pressures.  Patient reports over the last several weeks he has noticed episodes of soft blood pressures as low as 105/70 mmHg.  He also notes that his blood pressure cuff has been noting irregular heart rhythm occasionally over this period of time.  However patient is asymptomatic.  Denies palpitations, chest pain, dyspnea, dizziness, syncope, near syncope.  She does admit to snoring.  He is overall relatively active hiking and walking up to 1 to 2 miles 4 times weekly.  He also regularly fishes.  Past Medical History:  Diagnosis Date  . Gout    per pt stable  . History of COVID-19 02/2021  . History of kidney stones   . Hyperlipidemia   . Hyperplasia of prostate with lower urinary tract symptoms (LUTS)   . Hypertension   . Nephrolithiasis   . Prostate cancer [facility]) urologist-  dr dahlstedt/  oncologist- dr Tammi Klippel   dx 05/ 2019--- Stage T1c,  Gleason 3+4  . Wears glasses    Past Surgical History:  Procedure Laterality Date  . colonscopy  10/14/2018  . CYSTOSCOPY N/A 12/22/2018   Procedure: CYSTOSCOPY;  Surgeon: Franchot Gallo, MD;  Location: [facility];  Service: Urology;  Laterality: N/A;  . CYSTOSCOPY WITH RETROGRADE PYELOGRAM, URETEROSCOPY AND STENT  PLACEMENT Left 03/10/2022   Procedure: CYSTOSCOPY WITH RETROGRADE PYELOGRAM, URETEROSCOPY, STONE BASKETTING AND STENT PLACEMENT;  Surgeon: Franchot Gallo, MD;  Location[facility];  Service: Urology;  Laterality: Left;  . EXTRACORPOREAL SHOCK WAVE LITHOTRIPSY Left 07/27/2017   Procedure: LEFT EXTRACORPOREAL SHOCK WAVE LITHOTRIPSY (ESWL);  Surgeon: Festus Aloe, MD;  Location: [facility];  Service: Urology;  Laterality: Left;  . EXTRACORPOREAL SHOCK WAVE LITHOTRIPSY Right 07/23/2020   Procedure: EXTRACORPOREAL SHOCK WAVE LITHOTRIPSY (ESWL);  Surgeon: Irine Seal, MD;  Location: [facility];  Service: Urology;  Laterality: Right;  . FOOT SURGERY  1960s   removal foreign body,  per unsure which foot  . LUMBAR LAMINECTOMY/DECOMPRESSION MICRODISCECTOMY Left 02/11/2017   Procedure: LEFT LUMBAR FOUR- LUMBAR FIVE LAMINOTOMY,FORAMINOTOMY,  MICRODISCECTOMY;  Surgeon: Jovita Gamma, MD;  Location: [facility];  Service: Neurosurgery;  Laterality: Left;  . RADIOACTIVE SEED IMPLANT N/A 12/22/2018   Procedure: RADIOACTIVE SEED IMPLANT/BRACHYTHERAPY IMPLANT;  Surgeon: Franchot Gallo, MD;  Location: [facility];  Service: Urology;  Laterality: N/A;  . SHOULDER ARTHROSCOPY Left 2009  . SPACE OAR INSTILLATION N/A 12/22/2018   Procedure: SPACE OAR INSTILLATION;  Surgeon: Franchot Gallo, MD;  Location: [facility];  Service: Urology;  Laterality: N/A;   Family History  Problem Relation Age of Onset  . Heart disease Mother   . Lung cancer Father 41       smoked approximately 40-45 years then stopped  . Breast cancer Sister   .  Stroke Sister   . Ovarian cancer Sister   . Melanoma Brother   . Prostate cancer Maternal Grandfather   . Pancreatic cancer Neg Hx   . Colon cancer Neg Hx     Social History   Tobacco Use  . Smoking status: Never  . Smokeless tobacco: Never  Substance Use Topics  . Alcohol use: Yes    Comment: rarely   Marital Status: Married   ROS  Review of  Systems  Constitutional: Negative for malaise/fatigue and weight gain.  Cardiovascular:  Negative for chest pain, claudication, dyspnea on exertion, leg swelling, near-syncope, orthopnea, palpitations, paroxysmal nocturnal dyspnea and syncope.    Objective  Blood pressure (!) 149/76, pulse 82, temperature 98.8 F (37.1 C), temperature source Temporal, resp. rate 17, height '5\' 10"'$  (1.778 m), weight 222 lb 3.2 oz (100.8 kg), SpO2 99 %.     04/09/2022   10:45 AM 04/09/2022   10:44 AM 03/10/2022   12:33 PM  Vitals with BMI  Height  '5\' 10"'$    Weight  222 lbs 3 oz   BMI  61.44   Systolic 315 400 867  Diastolic 76 75 69  Pulse 82 83 70      Physical Exam Vitals reviewed.  Cardiovascular:     Rate and Rhythm: Normal rate and regular rhythm.     Pulses: Intact distal pulses.          Carotid pulses are 2+ on the right side with bruit and 2+ on the left side.      Radial pulses are 2+ on the right side and 2+ on the left side.       Dorsalis pedis pulses are 2+ on the right side and 2+ on the left side.       Posterior tibial pulses are 2+ on the right side and 2+ on the left side.     Heart sounds: S1 normal and S2 normal. No murmur heard.    No gallop.  Pulmonary:     Effort: Pulmonary effort is normal. No respiratory distress.     Breath sounds: No wheezing, rhonchi or rales.  Musculoskeletal:     Right lower leg: No edema.     Left lower leg: No edema.  Neurological:     Mental Status: He is alert.   Laboratory examination:   Recent Labs    03/10/22 0827  NA 143  K 4.7  CL 105  GLUCOSE 101*  BUN 27*  CREATININE 1.30*   CrCl cannot be calculated (Patient's most recent lab result is older than the maximum 21 days allowed.).     Latest Ref Rng & Units 03/10/2022    8:27 AM 12/15/2018    9:32 AM 02/09/2017    9:27 AM  CMP  Glucose 70 - 99 mg/dL 101  98  96   BUN 8 - 23 mg/dL '27  17  15   '$ Creatinine 0.61 - 1.24 mg/dL 1.30  1.11  1.27   Sodium 135 - 145 mmol/L 143  140   138   Potassium 3.5 - 5.1 mmol/L 4.7  4.5  4.0   Chloride 98 - 111 mmol/L 105  105  104   CO2 22 - 32 mmol/L  24  25   Calcium 8.9 - 10.3 mg/dL  9.2  9.1   Total Protein 6.5 - 8.1 g/dL  7.3    Total Bilirubin 0.3 - 1.2 mg/dL  0.7    Alkaline Phos 38 - 126 U/L  82    AST 15 - 41 U/L  17    ALT 0 - 44 U/L  26        Latest Ref Rng & Units 03/10/2022    8:27 AM 12/15/2018    9:32 AM 02/09/2017    9:27 AM  CBC  WBC 4.0 - 10.5 K/uL  5.3  7.4   Hemoglobin 13.0 - 17.0 g/dL 14.3  15.4  14.9   Hematocrit 39.0 - 52.0 % 42.0  46.4  44.0   Platelets 150 - 400 K/uL  181  174     Lipid Panel No results for input(s): "CHOL", "TRIG", "LDLCALC", "VLDL", "HDL", "CHOLHDL", "LDLDIRECT" in the last 8760 hours.  HEMOGLOBIN A1C No results found for: "HGBA1C", "MPG" TSH No results for input(s): "TSH" in the last 8760 hours.  External labs:  04/01/2022: Total cholesterol 161, triglycerides 146, HDL 45, LDL 87 Sodium 140, potassium 4.2, BUN 28, creatinine 1.26, ALT 32, AST 11, GFR 55  Allergies  No Known Allergies   Medications Prior to Visit:   Outpatient Medications Prior to Visit  Medication Sig Dispense Refill  . allopurinol (ZYLOPRIM) 300 MG tablet Take 300 mg by mouth daily.    Marland Kitchen amLODipine (NORVASC) 5 MG tablet Take 5 mg by mouth daily.    Marland Kitchen atorvastatin (LIPITOR) 20 MG tablet Take 20 mg by mouth daily.    . cholecalciferol (VITAMIN D) 1000 UNITS tablet Take 1,000 Units by mouth daily. Vitamin d  3    . folic acid (FOLVITE) 161 MCG tablet Take 800 mcg by mouth.    . losartan (COZAAR) 100 MG tablet Take 100 mg by mouth daily.    . nebivolol (BYSTOLIC) 10 MG tablet Take 10 mg by mouth daily.    . vitamin B-12 (CYANOCOBALAMIN) 1000 MCG tablet Take 1,000 mcg by mouth daily.    Marland Kitchen aspirin 81 MG tablet Take 1 tablet (81 mg total) every evening by mouth. (Patient taking differently: Take 81 mg by mouth. Patient stopped 02-18-2022 due to blood in urine) 30 tablet   . tamsulosin (FLOMAX) 0.4 MG  CAPS capsule Take 0.4 mg by mouth daily.     No facility-administered medications prior to visit.   Final Medications at End of Visit    Current Meds  Medication Sig  . allopurinol (ZYLOPRIM) 300 MG tablet Take 300 mg by mouth daily.  Marland Kitchen amLODipine (NORVASC) 5 MG tablet Take 5 mg by mouth daily.  Marland Kitchen apixaban (ELIQUIS) 5 MG TABS tablet Take 1 tablet (5 mg total) by mouth 2 (two) times daily.  Marland Kitchen atorvastatin (LIPITOR) 20 MG tablet Take 20 mg by mouth daily.  . cholecalciferol (VITAMIN D) 1000 UNITS tablet Take 1,000 Units by mouth daily. Vitamin d  3  . folic acid (FOLVITE) 096 MCG tablet Take 800 mcg by mouth.  . losartan (COZAAR) 100 MG tablet Take 100 mg by mouth daily.  . nebivolol (BYSTOLIC) 10 MG tablet Take 10 mg by mouth daily.  . vitamin B-12 (CYANOCOBALAMIN) 1000 MCG tablet Take 1,000 mcg by mouth daily.  . [DISCONTINUED] aspirin 81 MG tablet Take 1 tablet (81 mg total) every evening by mouth. (Patient taking differently: Take 81 mg by mouth. Patient stopped 02-18-2022 due to blood in urine)   Radiology:   No results found.  Cardiac Studies:   None  EKG:   Have requested EKG done yesterday by PCP  04/09/2022: Sinus rhythm at a rate of 81 bpm.  Normal axis.  No evidence of ischemia  or underlying injury  Assessment     ICD-10-CM   1. Primary hypertension  I10 EKG 12-Lead    2. Abnormal electrocardiogram  R94.31 PCV MYOCARDIAL PERFUSION WO LEXISCAN    3. Paroxysmal atrial fibrillation (HCC)  I48.0 Ambulatory referral to Sleep Studies    PCV ECHOCARDIOGRAM COMPLETE    LONG TERM MONITOR (3-14 DAYS)    4. Bruit of right carotid artery  R09.89 PCV CAROTID DUPLEX (BILATERAL)       Medications Discontinued During This Encounter  Medication Reason  . aspirin 81 MG tablet Discontinued by provider    Meds ordered this encounter  Medications  . apixaban (ELIQUIS) 5 MG TABS tablet    Sig: Take 1 tablet (5 mg total) by mouth 2 (two) times daily.    Dispense:  60 tablet     Refill:  3   This patients CHA2DS2-VASc Score 3 (HTN, Age) and yearly risk of stroke 3.2%.   Recommendations:   Gregory Buxton. is a 76 y.o.  Caucasian male with history of hypertension, hyperlipidemia.  Denies history of tobacco use, alcohol use, and diabetes.  Referred to our office by PCP for further evaluation and management of atrial fibrillation and hypertension.   Unfortunately PCPs EKG from yesterday is not available for review, have requested this.  Patient's EKG today reveals normal sinus rhythm.  Patient is asymptomatic from an A-fib standpoint.  Given CHA2DS2-VASc score of 3 patient was started on Eliquis by PCP yesterday, agree with this recommendation.  Therefore discussed with patient anticoagulation including risk, benefits, and indications.  Patient verbalized understanding and wishes to continue oral anticoagulation with Eliquis.  As patient is asymptomatic in regards to paroxysmal atrial fibrillation we will hold off on additional medications at this time.  Will obtain echocardiogram to evaluate for underlying functional or structural abnormalities.  We will also obtain stress test given multiple cardiovascular risk factors and new onset atrial fibrillation.  We will also obtain 2-week cardiac monitor to further evaluate patient's underlying atrial fibrillation burden.  New onset atrial fibrillation patient's history of snoring recommend ambulatory referral to sleep medicine, this has been sent.  In regard to hypertension management patient's blood pressure remains labile.  We will enroll him in remote patient monitoring with our office.  Will not make changes at this time, continue amlodipine and losartan as well as nebivolol.  Follow-up in 6 to 8 weeks, sooner if needed.    Alethia Berthold, PA-C 04/09/2022, 12:33 PM Office: [phone]

## 2022-04-18 DIAGNOSIS — I10 Essential (primary) hypertension: Secondary | ICD-10-CM

## 2022-05-14 ENCOUNTER — Ambulatory Visit: Payer: Medicare Other

## 2022-05-14 DIAGNOSIS — R0989 Other specified symptoms and signs involving the circulatory and respiratory systems: Secondary | ICD-10-CM | POA: Diagnosis not present

## 2022-05-14 DIAGNOSIS — I480 Paroxysmal atrial fibrillation: Secondary | ICD-10-CM

## 2022-05-18 NOTE — Progress Notes (Signed)
Minimal disease in the carotid. Nothing to worry

## 2022-05-19 ENCOUNTER — Inpatient Hospital Stay: Payer: Medicare Other

## 2022-05-19 ENCOUNTER — Ambulatory Visit: Payer: Medicare Other

## 2022-05-19 DIAGNOSIS — R9431 Abnormal electrocardiogram [ECG] [EKG]: Secondary | ICD-10-CM

## 2022-05-19 DIAGNOSIS — I480 Paroxysmal atrial fibrillation: Secondary | ICD-10-CM

## 2022-05-19 NOTE — Progress Notes (Signed)
Called pt no answer, left a vm

## 2022-05-20 NOTE — Progress Notes (Signed)
Called and spoke with patient regarding his CAD results.

## 2022-05-29 ENCOUNTER — Encounter: Payer: Self-pay | Admitting: Neurology

## 2022-05-29 ENCOUNTER — Ambulatory Visit: Payer: Medicare Other | Admitting: Neurology

## 2022-05-29 VITALS — BP 157/78 | HR 65 | Ht 70.0 in | Wt 221.0 lb

## 2022-05-29 DIAGNOSIS — I2510 Atherosclerotic heart disease of native coronary artery without angina pectoris: Secondary | ICD-10-CM | POA: Diagnosis not present

## 2022-05-29 DIAGNOSIS — E669 Obesity, unspecified: Secondary | ICD-10-CM | POA: Diagnosis not present

## 2022-05-29 DIAGNOSIS — Z9189 Other specified personal risk factors, not elsewhere classified: Secondary | ICD-10-CM | POA: Diagnosis not present

## 2022-05-29 DIAGNOSIS — E66811 Obesity, class 1: Secondary | ICD-10-CM

## 2022-05-29 DIAGNOSIS — I480 Paroxysmal atrial fibrillation: Secondary | ICD-10-CM | POA: Diagnosis not present

## 2022-05-29 DIAGNOSIS — R351 Nocturia: Secondary | ICD-10-CM

## 2022-05-29 NOTE — Patient Instructions (Signed)

## 2022-05-29 NOTE — Progress Notes (Signed)
Subjective:    Patient ID: Gregory Reid. is a 76 y.o. male.  HPI    Star Age, MD, PhD [facility] [address] [place], [place] [address]  Dear [name],    I saw your patient, Gregory Reid, your kind request in my sleep clinic today for initial consultation of his sleep disorder, in particular, concern for underlying obstructive sleep apnea.  The patient is unaccompanied today.  As you know, Mr. Gregory Reid is a 76 year old right-handed gentleman with an underlying medical history of paroxysmal A-fib, hypertension, hyperlipidemia, gout, history of kidney stones, prostate cancer and mild obesity, who reports snoring and some sleep disruption from nocturia.  He reports that his snoring can be disturbing to his wife.  I reviewed your office note from 04/09/2022.  His Epworth sleepiness score is 1 out of 24, fatigue severity score is 21 out of 63.  He was recently found to be in atrial fibrillation, he was started immediately on Eliquis by PCP, he is wearing a Zio patch currently until next week.  He has a follow-up appointment with Dr. Nadyne Coombes in about 2 weeks.  He has nocturia about twice per average night, sometimes only once, sometimes as many as 4 times.  He had prostate cancer treatment with seed implants some 4 years ago.  He has no family history of sleep apnea.  Bedtime varies, generally in bed somewhere between 8 PM and midnight.  Rise time depends on what he has going on for the day, he likes to go fishing and goes deer hunting and gets up.  Average rise time is around 6:30 AM.  He denies recurrent morning headaches.  He drinks caffeine in the form of coffee and soda, 1 serving each on average.  He drinks very rarely.  He is a non-smoker.  He is retired and lives with his wife, they have no children, no pets in the household.  Weight has been more or less stable.  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  His blood pressure  has been fluctuating.  He is working on weight loss.  His Past Medical History Is Significant For: 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)    Hypertension    Nephrolithiasis    Prostate cancer [facility]) urologist-  dr dahlstedt/  oncologist- dr Tammi Klippel   dx 05/ 2019--- Stage T1c,  Gleason 3+4   Wears glasses     His Past Surgical History Is Significant For: Past Surgical History:  Procedure Laterality Date   colonscopy  10/14/2018   CYSTOSCOPY N/A 12/22/2018   Procedure: CYSTOSCOPY;  Surgeon: Franchot Gallo, MD;  Location: [facility];  Service: Urology;  Laterality: N/A;   CYSTOSCOPY WITH RETROGRADE PYELOGRAM, URETEROSCOPY AND STENT PLACEMENT Left 03/10/2022   Procedure: CYSTOSCOPY WITH RETROGRADE PYELOGRAM, URETEROSCOPY, STONE BASKETTING AND STENT PLACEMENT;  Surgeon: Franchot Gallo, MD;  Location: [facility];  Service: Urology;  Laterality: Left;   EXTRACORPOREAL SHOCK WAVE LITHOTRIPSY Left 07/27/2017   Procedure: LEFT EXTRACORPOREAL SHOCK WAVE LITHOTRIPSY (ESWL);  Surgeon: Festus Aloe, MD;  Location: [facility];  Service: Urology;  Laterality: Left;   EXTRACORPOREAL SHOCK WAVE LITHOTRIPSY Right 07/23/2020   Procedure: EXTRACORPOREAL SHOCK WAVE LITHOTRIPSY (ESWL);  Surgeon: Irine Seal, MD;  Location: [place]  [place];  Service: Urology;  Laterality: Right;   FOOT SURGERY  1960s   removal foreign body,  per unsure which foot   LUMBAR LAMINECTOMY/DECOMPRESSION MICRODISCECTOMY Left 02/11/2017   Procedure: LEFT LUMBAR FOUR- [place],  MICRODISCECTOMY;  Surgeon: Jovita Gamma, MD;  Location: [place];  Service: Neurosurgery;  Laterality: Left;   RADIOACTIVE SEED IMPLANT N/A 12/22/2018   Procedure: RADIOACTIVE SEED IMPLANT/BRACHYTHERAPY IMPLANT;  Surgeon: Franchot Gallo, MD;  Location: [facility];  Service: Urology;  Laterality: N/A;   SHOULDER ARTHROSCOPY Left 2009   SPACE OAR INSTILLATION N/A 12/22/2018   Procedure: SPACE OAR INSTILLATION;  Surgeon: Franchot Gallo, MD;  Location: [facility];  Service: Urology;  Laterality: N/A;    His Family History Is Significant For: Family History  Problem Relation Age of Onset   Heart disease Mother    Lung cancer Father 37       smoked approximately 40-45 years then stopped   Breast cancer Sister    Stroke Sister    Ovarian cancer Sister    Melanoma Brother    Prostate cancer Maternal Grandfather    Pancreatic cancer Neg Hx    Colon cancer Neg Hx     His Social History Is Significant For: Social History   Socioeconomic History   Marital status: Married    Spouse name: Manufacturing systems engineer   Number of children: 0   Years of education: Not on file   Highest education level: Not on file  Occupational History    Comment: retired  Tobacco Use   Smoking status: Never   Smokeless tobacco: Never  Vaping Use   Vaping Use: Never used  Substance and Sexual Activity   Alcohol use: Yes    Comment: rarely   Drug use: Never   Sexual activity: Not on file  Other Topics Concern   Not on file  Social History Narrative   Not on file   Social Determinants of Health   Financial Resource Strain: Not on file  Food Insecurity: Not on file  Transportation Needs: Not on file  Physical Activity: Not on file  Stress: Not on file  Social Connections: Not on file    His Allergies Are:  No Known Allergies:   His Current Medications Are:  Outpatient Encounter Medications as of 05/29/2022  Medication Sig   allopurinol (ZYLOPRIM) 300 MG tablet Take 300 mg by mouth daily.   amLODipine (NORVASC) 5 MG tablet Take 5 mg by mouth daily.   apixaban (ELIQUIS) 5 MG TABS tablet Take 1 tablet (5 mg total) by mouth 2 (two) times daily.   atorvastatin (LIPITOR) 20 MG tablet Take 20 mg by mouth daily.   cholecalciferol  (VITAMIN D) 1000 UNITS tablet Take 1,000 Units by mouth daily. Vitamin d  3   folic acid (FOLVITE) 245 MCG tablet Take 800 mcg by mouth.   losartan (COZAAR) 100 MG tablet Take 75 mg by mouth daily.   nebivolol (BYSTOLIC) 10 MG tablet Take 5 mg by mouth daily.   vitamin B-12 (CYANOCOBALAMIN) 1000 MCG tablet Take 1,000 mcg by mouth daily.   tamsulosin (FLOMAX) 0.4 MG CAPS capsule Take 0.4 mg by mouth daily.   No facility-administered encounter medications on file as of 05/29/2022.  :   Review of Systems:  Out of a complete 14 point review of systems, all are reviewed and negative with the exception of these symptoms as listed below:  Review of Systems  Neurological:  Pt here for sleep Consult Pt snores,hypertensive. Pt denies fatigue,sleep study,CPAP,headaches     ESS:1 FSS:21    Objective:  Neurological Exam  Physical Exam Physical Examination:   Vitals:   05/29/22 1130  BP: (!) 157/78  Pulse: 65    General Examination: The patient is a very pleasant 76 y.o. male in no acute distress. He appears well-developed and well-nourished and well groomed.   HEENT: Normocephalic, atraumatic, pupils are equal, round and reactive to light, extraocular tracking is good without limitation to gaze excursion or nystagmus noted. Hearing is grossly intact. Face is symmetric with normal facial animation. Speech is clear with no dysarthria noted. There is no hypophonia. There is no lip, neck/head, jaw or voice tremor. Neck is supple with full range of passive and active motion. There are no carotid bruits on auscultation. Oropharynx exam reveals: mild mouth dryness, adequate dental hygiene and moderate airway crowding, due to tonsillar size of about 1+, redundant soft palate and longer uvula.  Mallampati class II. Tongue protrudes centrally and palate elevates symmetrically.  Neck circumference of 18-1/2 inches, no significant overbite.    Chest: Clear to auscultation without wheezing, rhonchi  or crackles noted.  Zio patch in place.  Slight skin irritation around it, no actual rash, almost like a healing bruise.    Heart: S1+S2+0, regular and normal without murmurs, rubs or gallops noted.   Abdomen: Soft, non-tender and non-distended.  Extremities: There is trace pitting edema in the distal lower extremities bilaterally.   Skin: Warm and dry without trophic changes noted.   Musculoskeletal: exam reveals no obvious joint deformities.   Neurologically:  Mental status: The patient is awake, alert and oriented in all 4 spheres. His immediate and remote memory, attention, language skills and fund of knowledge are appropriate. There is no evidence of aphasia, agnosia, apraxia or anomia. Speech is clear with normal prosody and enunciation. Thought process is linear. Mood is normal and affect is normal.  Cranial nerves II - XII are as described above under HEENT exam.  Motor exam: Normal bulk, strength and tone is noted. There is no obvious tremor. Fine motor skills and coordination: grossly intact.  Cerebellar testing: No dysmetria or intention tremor. There is no truncal or gait ataxia.  Sensory exam: intact to light touch in the upper and lower extremities.  Gait, station and balance: He stands easily. No veering to one side is noted. No leaning to one side is noted. Posture is age-appropriate and stance is narrow based. Gait shows normal stride length and normal pace. No problems turning are noted.   Assessment and Plan:  In summary, Chanse Kagel. is a very pleasant 76 y.o.-year old male with an underlying medical history of paroxysmal A-fib, hypertension, hyperlipidemia, gout, history of kidney stones, prostate cancer and mild obesity, whose history and physical exam are concerning for sleep disordered breathing, supporting a current working diagnosis of unspecified sleep apnea, with the main differential diagnoses of obstructive sleep apnea (OSA) versus upper airway resistance  syndrome (UARS) versus central sleep apnea (CSA), or mixed sleep apnea. A laboratory attended sleep study is considered gold standard for evaluation of sleep disordered breathing and is recommended at this time and clinically justified.   I had a long chat with the patient about my findings and the diagnosis of sleep apnea, particularly OSA, its prognosis and treatment options. We talked about medical/conservative treatments, surgical interventions and non-pharmacological approaches for symptom control. I explained, in particular, the risks and ramifications of untreated  moderate to severe OSA, especially with respect to developing cardiovascular disease down the road, including congestive heart failure (CHF), difficult to treat hypertension, cardiac arrhythmias (particularly A-fib), neurovascular complications including TIA, stroke and dementia. Even type 2 diabetes has, in part, been linked to untreated OSA. Symptoms of untreated OSA may include (but may not be limited to) daytime sleepiness, nocturia (i.e. frequent nighttime urination), memory problems, mood irritability and suboptimally controlled or worsening mood disorder such as depression and/or anxiety, lack of energy, lack of motivation, physical discomfort, as well as recurrent headaches, especially morning or nocturnal headaches. We talked about the importance of maintaining a healthy lifestyle and striving for healthy weight.   I recommended the following at this time: sleep study.  I outlined the differences between a laboratory attended sleep study which is considered more comprehensive and accurate over the option of a home sleep test (HST); the latter may lead to underestimation of sleep disordered breathing in some instances and does not help with diagnosing upper airway resistance syndrome and is not accurate enough to diagnose primary central sleep apnea typically. I explained the different sleep test procedures to the patient in detail and  also outlined possible surgical and non-surgical treatment options of OSA, including the use of a pressure airway pressure (PAP) device (ie CPAP, AutoPAP/APAP or BiPAP in certain circumstances), a custom-made dental device (aka oral appliance, which would require a referral to a specialist dentist or orthodontist typically, and is generally speaking not considered a good choice for patients with full dentures or edentulous state), upper airway surgical options, such as traditional UPPP (which is not considered a first-line treatment) or the Inspire device (hypoglossal nerve stimulator, which would involve a referral for consultation with an ENT surgeon, after careful selection, following inclusion criteria). He had specifically seen an ad for inspire and had some questions about it which I answered.   I explained the PAP treatment option to the patient in detail, as this is generally considered first-line treatment.  I showed him a model for an AutoPap machine as well.  The patient indicated that he would be willing to try PAP therapy, if the need arises. I explained the importance of being compliant with PAP treatment, not only for insurance purposes but primarily to improve patient's symptoms symptoms, and for the patient's long term health benefit, including to reduce His cardiovascular risks longer-term.    We will pick up our discussion about the next steps and treatment options after testing.  We will keep him posted as to the test results by phone call and/or MyChart messaging where possible.  We will plan to follow-up in sleep clinic accordingly as well.  I answered all his questions today and the patient was in agreement.   I encouraged him to call with any interim questions, concerns, problems or updates or email [place] through [place].  Generally speaking, sleep test authorizations may take up to 2 weeks, sometimes less, sometimes longer, the patient is encouraged to get in touch with [place] if they do not  hear back from the sleep lab staff directly within the next 2 weeks.  Thank you very much for allowing me to participate in the care of this nice patient. If I can be of any further assistance to you please do not hesitate to call me at [phone].  Sincerely,   Star Age, MD, PhD

## 2022-06-06 DIAGNOSIS — I480 Paroxysmal atrial fibrillation: Secondary | ICD-10-CM | POA: Diagnosis not present

## 2022-06-10 ENCOUNTER — Telehealth: Payer: Self-pay

## 2022-06-10 NOTE — Telephone Encounter (Signed)
LVM for pt to call back to schedule sleep study.  

## 2022-06-11 ENCOUNTER — Ambulatory Visit: Payer: Medicare Other | Admitting: Cardiology

## 2022-06-12 ENCOUNTER — Encounter: Payer: Self-pay | Admitting: Cardiology

## 2022-06-12 ENCOUNTER — Ambulatory Visit: Payer: Medicare Other | Admitting: Cardiology

## 2022-06-12 VITALS — BP 147/71 | HR 71 | Temp 99.0°F | Resp 16 | Ht 70.0 in | Wt 221.0 lb

## 2022-06-12 DIAGNOSIS — E7800 Pure hypercholesterolemia, unspecified: Secondary | ICD-10-CM

## 2022-06-12 DIAGNOSIS — I480 Paroxysmal atrial fibrillation: Secondary | ICD-10-CM | POA: Diagnosis not present

## 2022-06-12 DIAGNOSIS — I10 Essential (primary) hypertension: Secondary | ICD-10-CM | POA: Diagnosis not present

## 2022-06-12 DIAGNOSIS — R0989 Other specified symptoms and signs involving the circulatory and respiratory systems: Secondary | ICD-10-CM

## 2022-06-12 NOTE — Progress Notes (Signed)
Primary Physician/Referring:  Holland Commons, FNP  Patient ID: Gregory Filbert., male    DOB: 02-16-1946, 76 y.o.   MRN: [id number]  Chief Complaint  Patient presents with   Follow-up   HPI:    Gregory Corrales.  is a 76 y.o. Caucasian male with history of hypertension, hyperlipidemia, fairly active person otherwise no significant cardiovascular issues in the past, seen by [place] on 04/09/2022, after he was found to be in atrial fibrillation PCP office on urgent basis, patient had irregular heartbeat by blood pressure monitor at home 2 to 3 days prior but otherwise asymptomatic.    He has not had any symptoms since last seen.  He is overall relatively active hiking and walking up to 1 to 2 miles 4 times weekly.  He also regularly fishes.  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)    Hypertension    Nephrolithiasis    Prostate cancer [facility]) urologist-  dr dahlstedt/  oncologist- dr Tammi Klippel   dx 05/ 2019--- Stage T1c,  Gleason 3+4   Wears glasses    Past Surgical History:  Procedure Laterality Date   colonscopy  10/14/2018   CYSTOSCOPY N/A 12/22/2018   Procedure: CYSTOSCOPY;  Surgeon: Franchot Gallo, MD;  Location: [facility];  Service: Urology;  Laterality: N/A;   CYSTOSCOPY WITH RETROGRADE PYELOGRAM, URETEROSCOPY AND STENT PLACEMENT Left 03/10/2022   Procedure: CYSTOSCOPY WITH RETROGRADE PYELOGRAM, URETEROSCOPY, STONE BASKETTING AND STENT PLACEMENT;  Surgeon: Franchot Gallo, MD;  Location: [facility];  Service: Urology;  Laterality: Left;   EXTRACORPOREAL SHOCK WAVE LITHOTRIPSY Left 07/27/2017   Procedure: LEFT EXTRACORPOREAL SHOCK WAVE LITHOTRIPSY (ESWL);  Surgeon: Festus Aloe, MD;  Location: [facility];  Service: Urology;  Laterality: Left;   EXTRACORPOREAL SHOCK WAVE LITHOTRIPSY Right  07/23/2020   Procedure: EXTRACORPOREAL SHOCK WAVE LITHOTRIPSY (ESWL);  Surgeon: Irine Seal, MD;  Location: [facility];  Service: Urology;  Laterality: Right;   FOOT SURGERY  1960s   removal foreign body,  per unsure which foot   LUMBAR LAMINECTOMY/DECOMPRESSION MICRODISCECTOMY Left 02/11/2017   Procedure: LEFT LUMBAR FOUR- [place],  MICRODISCECTOMY;  Surgeon: Jovita Gamma, MD;  Location: [place];  Service: Neurosurgery;  Laterality: Left;   RADIOACTIVE SEED IMPLANT N/A 12/22/2018   Procedure: RADIOACTIVE SEED IMPLANT/BRACHYTHERAPY IMPLANT;  Surgeon: Franchot Gallo, MD;  Location: [facility];  Service: Urology;  Laterality: N/A;   SHOULDER ARTHROSCOPY Left 2009   SPACE OAR INSTILLATION N/A 12/22/2018   Procedure: SPACE OAR INSTILLATION;  Surgeon: Franchot Gallo, MD;  Location: [facility];  Service: Urology;  Laterality: N/A;   Family History  Problem Relation Age of Onset   Heart disease Mother    Lung cancer Father 19       smoked approximately 40-45 years then stopped   Breast cancer Sister    Stroke Sister    Ovarian cancer Sister    Melanoma Brother    Prostate cancer Maternal Grandfather    Pancreatic cancer Neg Hx    Colon cancer Neg Hx     Social History   Tobacco Use   Smoking status: Never   Smokeless tobacco: Never  Substance Use Topics   Alcohol use: Yes    Comment: rarely   Marital Status: Married   ROS  Review of Systems  Cardiovascular:  Negative for chest pain, dyspnea on exertion and leg swelling.    Objective  Blood pressure (!) 147/71, pulse 71, temperature 99 F (37.2 C), temperature source Temporal, resp. rate 16, height '5\' 10"'$  (1.778 m), weight 221 lb (100.2 kg), SpO2 99 %.     06/12/2022    9:27 AM 05/29/2022   11:30 AM 04/09/2022   10:45 AM  Vitals with BMI  Height '5\' 10"'$  '5\' 10"'$    Weight 221 lbs 221 lbs   BMI 84.69 62.95   Systolic 284 132 440  Diastolic 71 78 76  Pulse 71 65 82      Physical  Exam Neck:     Vascular: No carotid bruit or JVD.  Cardiovascular:     Rate and Rhythm: Normal rate and regular rhythm.     Pulses: Intact distal pulses.     Heart sounds: Normal heart sounds. No murmur heard.    No gallop.  Pulmonary:     Effort: Pulmonary effort is normal.     Breath sounds: Normal breath sounds.  Abdominal:     General: Bowel sounds are normal.     Palpations: Abdomen is soft.  Musculoskeletal:     Right lower leg: No edema.     Left lower leg: No edema.    Laboratory examination:   Recent Labs    03/10/22 0827  NA 143  K 4.7  CL 105  GLUCOSE 101*  BUN 27*  CREATININE 1.30*   CrCl cannot be calculated (Patient's most recent lab result is older than the maximum 21 days allowed.).     Latest Ref Rng & Units 03/10/2022    8:27 AM 12/15/2018    9:32 AM 02/09/2017    9:27 AM  CMP  Glucose 70 - 99 mg/dL 101  98  96   BUN 8 - 23 mg/dL '27  17  15   '$ Creatinine 0.61 - 1.24 mg/dL 1.30  1.11  1.27   Sodium 135 - 145 mmol/L 143  140  138   Potassium 3.5 - 5.1 mmol/L 4.7  4.5  4.0   Chloride 98 - 111 mmol/L 105  105  104   CO2 22 - 32 mmol/L  24  25   Calcium 8.9 - 10.3 mg/dL  9.2  9.1   Total Protein 6.5 - 8.1 g/dL  7.3    Total Bilirubin 0.3 - 1.2 mg/dL  0.7    Alkaline Phos 38 - 126 U/L  82    AST 15 - 41 U/L  17    ALT 0 - 44 U/L  26        Latest Ref Rng & Units 03/10/2022    8:27 AM 12/15/2018    9:32 AM 02/09/2017    9:27 AM  CBC  WBC 4.0 - 10.5 K/uL  5.3  7.4   Hemoglobin 13.0 - 17.0 g/dL 14.3  15.4  14.9   Hematocrit 39.0 - 52.0 % 42.0  46.4  44.0   Platelets 150 - 400 K/uL  181  174     External labs:   04/01/2022: Total cholesterol 161, triglycerides 146, HDL 45, LDL 87 Sodium 140, potassium 4.2, BUN 28, creatinine 1.26, ALT 32, AST 11, GFR 55  Allergies  No Known Allergies  Final Medications at End of Visit     Current Outpatient Medications:    allopurinol (ZYLOPRIM) 300 MG tablet, Take 300 mg by mouth daily., Disp: , Rfl:  amLODipine (NORVASC) 5 MG tablet, Take 5 mg by mouth daily., Disp: , Rfl:    apixaban (ELIQUIS) 5 MG TABS tablet, Take 1 tablet (5 mg total) by mouth 2 (two) times daily., Disp: 60 tablet, Rfl: 3   atorvastatin (LIPITOR) 20 MG tablet, Take 20 mg by mouth daily., Disp: , Rfl:    cholecalciferol (VITAMIN D) 1000 UNITS tablet, Take 1,000 Units by mouth daily. Vitamin d  3, Disp: , Rfl:    folic acid (FOLVITE) 160 MCG tablet, Take 800 mcg by mouth., Disp: , Rfl:    losartan (COZAAR) 100 MG tablet, Take 100 mg by mouth every evening. Increase to 100 mg from 75 mg 06/12/22, Disp: , Rfl:    nebivolol (BYSTOLIC) 10 MG tablet, Take 5 mg by mouth daily., Disp: , Rfl:    tamsulosin (FLOMAX) 0.4 MG CAPS capsule, Take 0.4 mg by mouth daily., Disp: , Rfl:    vitamin B-12 (CYANOCOBALAMIN) 1000 MCG tablet, Take 1,000 mcg by mouth daily., Disp: , Rfl:    Radiology:   No results found.  Cardiac Studies:   PCV MYOCARDIAL PERFUSION WO LEXISCAN 05/19/2022  Narrative Exercise nuclear stress test 05/19/22 Myocardial perfusion is normal. Some diaphragmatic attenuation noted. Overall LV systolic function is normal without regional wall motion abnormalities. Stress LV EF: 58%. Low risk study. Normal ECG stress. The patient exercised for 6 minutes and 38 seconds of a Bruce protocol, achieving approximately 8.01 METs and 87% MPHR. The heart rate response was normal.  The blood pressure response was normal. No previous exam available for comparison.   PCV ECHOCARDIOGRAM COMPLETE 05/14/2022  Narrative Echocardiogram 05/14/2022: Normal LV systolic function with visual EF 65-70%. Left ventricle cavity is normal in size. Normal left ventricular wall thickness. Normal global wall motion. Doppler evidence of grade I (impaired) diastolic dysfunction, normal LAP. Calculated EF 73%. Structurally normal tricuspid valve.  Mild tricuspid regurgitation. No evidence of pulmonary hypertension. RVSP measures 33 mmHg. no prior  available for comparison  Zio Patch Extended out patient EKG monitoring 11 days starting 05/19/2022:  Predominant rhythm is normal sinus rhythm.  Minimum heart rate 41, maximum heart rate 103 bpm.  There were 22 atrial tachycardia episodes longest lasting 12 seconds at the rate of 123 bpm.  There are other isolated PACs and atrial couplets and rare triplets. There are occasional PVCs, 2.2% burden and occasional ventricular couplets.  Occasional ventricular bigeminy and trigeminy were present. There were no patient activated events. There is no atrial fibrillation, there was no heart block.    Carotid artery duplex 05/14/2022: Duplex suggests stenosis in the right internal carotid artery (1-15%). Duplex suggests stenosis in the right external carotid artery (<50%). No evidence of significant stenosis in the left internal carotid artery. Duplex suggests stenosis in the left external carotid artery (<50%). There is very mild plaque noted in the bilateral carotid arteries. Antegrade right vertebral artery flow. Antegrade left vertebral artery flow. External carotid artery stenosis probably source of bruit.    EKG:   04/09/2022: Sinus rhythm at a rate of 81 bpm.  Normal axis.  No evidence of ischemia or underlying injury  Assessment     ICD-10-CM   1. Paroxysmal atrial fibrillation (HCC)  I48.0     2. Primary hypertension  I10     3. Bruit of right carotid artery  R09.89     4. Pure hypercholesterolemia  E78.00        There are no discontinued medications.   No orders of the defined types  were placed in this encounter.  This patients CHA2DS2-VASc Score 3 (HTN, Age) and yearly risk of stroke 3.2%.   Recommendations:   Gregory Borba. is a 76 y.o.  Caucasian male with history of hypertension, hyperlipidemia, fairly active person otherwise no significant cardiovascular issues in the past, seen by [place] on 04/09/2022, after he was found to be in atrial fibrillation PCP office on  urgent basis, patient had irregular heartbeat by blood pressure monitor at home 2 to 3 days prior but otherwise asymptomatic.    He has not had any symptoms since last seen, reviewed the results of the echocardiogram and nuclear stress test, low risk stress test and echocardiogram does not reveal significant valvular abnormality or structural abnormality.  He has now been referred for sleep evaluation to exclude sleep apnea as etiology for new onset A-fib.  Sleep study is pending.  Extensive review with the patient regarding need for anticoagulation versus conservative management with no anticoagulation.  After lengthy discussion, patient prefers to be on anticoagulation at least for the next 6 months or so, he would like to lose his weight and complete the sleep study and then decide which way to go.  With regard to hypertension management, systolic blood pressure is slightly elevated, he has been taking all his medications in the morning.  He is also taking losartan 75 mg in the morning.  I suggested to start 100 mg in the evening but continue Bystolic 10 mg along with amlodipine 5 mg in the morning.  We will continue RPM, if blood pressure still remains elevated, we could increase the dose of the amlodipine to 10 mg.  I would like to see him back in 6 months for follow-up.  This was a 40-minute office visit encounter with regard to counseling of anticoagulation and high risk medication use, discussion regarding weight loss, management of hypertension.     RPM Patient: Gregory Reid  Average Systolic BP Level 628.31 mmHg Lowest Systolic BP Level 517 mmHg Highest Systolic BP Level 616 mmHg DBP  in 70s   Adrian Prows, MD, [facility] 06/12/2022, 10:09 AM Office: [phone] Fax: [phone] Pager: [phone]

## 2022-06-18 NOTE — Telephone Encounter (Signed)
[employer]- [employer] medicare no auth req-pt chose.  Patient is scheduled at [facility] for 07/29/22 at 9 pm.

## 2022-06-27 DIAGNOSIS — I10 Essential (primary) hypertension: Secondary | ICD-10-CM | POA: Diagnosis not present

## 2022-07-18 DIAGNOSIS — H2513 Age-related nuclear cataract, bilateral: Secondary | ICD-10-CM | POA: Diagnosis not present

## 2022-07-18 DIAGNOSIS — H5211 Myopia, right eye: Secondary | ICD-10-CM | POA: Diagnosis not present

## 2022-07-28 DIAGNOSIS — I10 Essential (primary) hypertension: Secondary | ICD-10-CM | POA: Diagnosis not present

## 2022-07-29 ENCOUNTER — Ambulatory Visit (INDEPENDENT_AMBULATORY_CARE_PROVIDER_SITE_OTHER): Payer: Medicare Other | Admitting: Neurology

## 2022-07-29 DIAGNOSIS — R351 Nocturia: Secondary | ICD-10-CM

## 2022-07-29 DIAGNOSIS — E669 Obesity, unspecified: Secondary | ICD-10-CM

## 2022-07-29 DIAGNOSIS — G4720 Circadian rhythm sleep disorder, unspecified type: Secondary | ICD-10-CM

## 2022-07-29 DIAGNOSIS — G4733 Obstructive sleep apnea (adult) (pediatric): Secondary | ICD-10-CM | POA: Diagnosis not present

## 2022-07-29 DIAGNOSIS — I2510 Atherosclerotic heart disease of native coronary artery without angina pectoris: Secondary | ICD-10-CM

## 2022-07-29 DIAGNOSIS — E66811 Obesity, class 1: Secondary | ICD-10-CM

## 2022-07-29 DIAGNOSIS — Z9189 Other specified personal risk factors, not elsewhere classified: Secondary | ICD-10-CM

## 2022-07-29 DIAGNOSIS — I480 Paroxysmal atrial fibrillation: Secondary | ICD-10-CM

## 2022-07-31 NOTE — Addendum Note (Signed)
Addended by: Star Age on: 07/31/2022 06:37 PM   Modules accepted: Orders

## 2022-07-31 NOTE — Procedures (Signed)
Physician Interpretation:     [facility] SPLIT NIGHT INTERPRETATION REPORT   STUDY DATE: 07/29/2022     PATIENT NAME:  Gregory Reid, Gregory Reid         DATE OF BIRTH:  [birth year]  PATIENT ID:  [id number]    TYPE OF STUDY:  PSG  READING PHYSICIAN: Star Age, MD, PhD  SCORING TECHNICIAN: Sheena Fields  Referred by: Lawerance Cruel, PA  INDICATIONS:  76 year old male with a history of paroxysmal A-fib, hypertension, hyperlipidemia, gout, history of kidney stones, prostate cancer and mild obesity, who reports snoring and sleep disruption from nocturia. His Epworth sleepiness score is 1 out of 24, fatigue severity score is 21 out of 63. Height: 70.0 in Weight: 221 lb (BMI 31) Neck Size: 18.5 in   Medications: Zyloprim, Norvasc, Eliquis, Lipitor, Vitamin D, Folvite, Cozaar, Bystolic, Vitamin B 12, Flomax  DESCRIPTION: A sleep technologist was in attendance for the duration of the recording.  Data collection, scoring, video monitoring, and reporting were performed in compliance with the AASM Manual for the Scoring of Sleep and Associated Events; (Hypopnea is scored based on the criteria listed in Section VIII D. 1b in the AASM Manual V2.6 using a 4% oxygen desaturation rule or Hypopnea is scored based on the criteria listed in Section VIII D. 1a in the AASM Manual V2.6 using 3% oxygen desaturation and /or arousal rule).  A physician certified by the American Board of Sleep Medicine reviewed each epoch of the study.  STUDY DETAILS: Lights off was at 21:28: and lights on 05:02: (453 minutes hours in bed). This study was performed with an initial diagnostic portion followed by positive airway pressure titration. The patient qualified for an emergency split sleep study secondary to severe sleep disordered breathing.  DIAGNOSTIC ANALYSIS   SLEEP CONTINUITY AND SLEEP ARCHITECTURE:  The diagnostic portion of the study began at 21:28 and ended at 01:51, for a recording  time of 4h 22.67mminutes.  Total sleep time was 127 minutes minutes (29.5% supine;  70.5% lateral;  0.0% prone, 13.0% REM sleep), with a decreased sleep efficiency at 48.4%. Sleep latency was normal at 12.5 minutes. REM sleep latency was normal 97.5 minutes.  Arousal index was 22.7 /hr. Of the total sleep time, the percentage of stage N1 sleep was 11.4%, which is increased, stage N2 sleep was 55.1%, stage N3 sleep was 20.5%, and REM sleep was 13.0%. Wake after sleep onset (WASO) time accounted for 123 minutes with moderate sleep fragmentation noted.   AROUSAL (Baseline): There were 37.0 arousals in total, for an arousal index of 17.5 arousals/hour.  Of these, 33.0 were identified as respiratory-related arousals (15.6 /hr), 0 were PLM-related arousals (0.0 /hr), and 15 were non-specific arousals (7.1 /hr)   RESPIRATORY MONITORING:  Based on CMS criteria (using a 4% oxygen desaturation rule for scoring hypopneas), there were 15 apneas (14 obstructive; 1 central; 0 mixed), and 75 hypopneas.  Apnea index was 6.6. Hypopnea index was 32.6. The apnea-hypopnea index was 39.2 overall (17.5 supine; 47.3 REM, 0.0 supine REM). There were 0 respiratory effort-related arousals (RERAs).  The RERA index was 0.0 events/hr. Total respiratory disturbance index (RDI) was 39.2 events/hr. RDI results showed: supine RDI  59.2 /hr; non-supine RDI 30.8 /hr; REM RDI 47.3 /hr, supine REM RDI 0.0 /hr.  Based on AASM criteria (using a 3% oxygen desaturation and /or arousal rule for scoring hypopneas), there were 15 apneas (14 obstructive; 1 central; 0 mixed), and 75 hypopneas.  Apnea index was  6.6. Hypopnea index was 34.5. The apnea-hypopnea index was 41.1 overall (18.4 supine; 47.3 REM, 0.0 supine REM). There were 0 respiratory effort-related arousals (RERAs). Total respiratory disturbance index (RDI) was 41.1 events/hr. RDI results showed: supine RDI 62.4 /hr; non-supine RDI 32.2 /hr; REM RDI 47.3 /hr, supine REM RDI 0.0 /hr.    Respiratory events were associated with oxyhemoglobin desaturations (nadir 81%) from a normal baseline (mean 96%). Total time spent at, or below 88% was 7.8 minutes, or 6.1%  of total sleep time.    LIMB MOVEMENTS: There were 0 periodic limb movements of sleep (0.0/hr), of which 0 (0.0/hr) were associated with an arousal.   OXIMETRY: Total sleep time spent at, or below 88% was 2.7 minutes, or 2.1% of total sleep time.    BODY POSITION: Duration of total sleep and percent of total sleep in their respective position is as follows: supine 37 minutes minutes (29.5%), non-supine 89.5 minutes (70.5%); right 31 minutes minutes (24.4%), left 58 minutes minutes (46.1%), and prone 00 minutes minutes (0.0%). Total supine REM sleep time was 00 minutes minutes (0.0% of total REM sleep).   Analysis of electrocardiogram activity showed the highest heart rate for the baseline portion of the study was 97.0 beats per minute.  The average heart rate during sleep was 63 bpm, while the highest heart rate for the same period was 81 bpm.    TREATMENT ANALYSIS SLEEP CONTINUITY AND SLEEP ARCHITECTURE:  The treatment portion of the study began at 01:51 and ended at 05:02, for a recording time of 3h 11.77mminutes.  Total sleep time was 103 minutes minutes (0.0% supine;  100.0% lateral; 0.0% prone, 21.3% REM sleep), with a decreased sleep efficiency at 54.2%. Sleep latency was increased at 39.0 minutes. REM sleep latency was normal at 68.0 minutes.  Arousal index was 5.2 /hr. Of the total sleep time, the percentage of stage N1 sleep was 7.2%, stage N3 sleep was 35.7%, and REM sleep was 21.3%. There were 1 Stage R periods observed during this portion of the study, 5 awakenings (i.e. transitions to Stage W from any sleep stage), and 18.0 total stage transitions. Wake after sleep onset (WASO) time accounted for 48 minutes.   AROUSAL: There were 7.0 arousals in total, for an arousal index of 4.1 arousals/hour.  Of these, 2.0 were  identified as respiratory-related arousals (1.2 /hr), 0 were PLM-related arousals (0.0 /hr), and 7 were non-specific arousals (4.1 /hr)  RESPIRATORY MONITORING:    Respiratory events were associated with oxyhemoglobin desaturation (nadir 88.0%) from a mean of 98.0%.  Total time spent at, or below 88% was 0.1 minutes, or 0.0%  of total sleep time.  Snoring was absent:  . There were 0.0 occurrences of Cheyne Stokes breathing.  LIMB MOVEMENTS: There were 0 periodic limb movements of sleep (0.0/hr), of which 0 (0.0/hr) were associated with an arousal.   OXIMETRY: Total sleep time spent at, or below 88% was 0.0 minutes, or 0.0% of total sleep time. Snoring was classified as .   BODY POSITION: Duration of total sleep and percent of total sleep in their respective position is as follows: supine 00 minutes minutes (0.0%), non-supine 103.5 minutes (100.0%); right 00 minutes minutes (0.0%), left 103 minutes minutes (100.0%), and prone 00 minutes minutes (0.0%). Total supine REM sleep time was 00 minutes minutes (0.0% of total REM sleep).   TITRATION DETAILS (SEE ALSO TABLE AT THE END OF THE REPORT):  The patient was shown several different interfaces and was subsequently fitted with  a medium fullface mask (Simplus) from [employer].  The patient was started on a pressure of 5 cm of water pressure without EPR, and gradually titrated to a final titration pressure of 10 cm with EPR of 2.  The AHI was improved and optimized at a pressure of 10 cm, on which the patient achieved a total sleep time of 27.5 minutes.  Residual AHI was 2.2/hour, O2 nadir of 92%, with nonsupine REM sleep achieved.     EEG: Review of the EEG showed no abnormal electrical discharges and symmetrical bihemispheric findings.     EKG: The EKG revealed normal sinus rhythm (NSR). The average heart rate during sleep was 65 bpm.    AUDIO/VIDEO REVIEW: The audio and video review did not show any abnormal or unusual behaviors, movements,  phonations or vocalizations. The patient took 1 restroom break. Snoring was moderate to loud at baseline, improved with PAP therapy.   POST-STUDY QUESTIONNAIRE: Post study, the patient indicated, that sleep was the same as usual. The patient stated in the comment section of the questionnaire that "it will take some getting used to".   IMPRESSION:   1. Severe Obstructive sleep apnea (OSA) 2. Dysfunctions associated with sleep stages or arousal from sleep   RECOMMENDATIONS:    1. This patient has severe obstructive sleep apnea and responded well to CPAP therapy. The patient qualified for an emergency split sleep study secondary to severe sleep disordered breathing. The baseline AHI was 39.2/h, O2 nadir 81%.  CPAP of 10 cm resulted in significant reduction of his sleep disordered breathing.  I recommend home CPAP therapy at a pressure of 10 cm via medium fullface mask with heated humidity, or mask of choice, sized to fit. The patient will be advised to be fully compliant with PAP therapy to improve sleep related symptoms and decrease long term cardiovascular risks. Please note that untreated obstructive sleep apnea may carry additional perioperative morbidity. Patients with significant obstructive sleep apnea should receive perioperative PAP therapy and the surgeons and particularly the anesthesiologist should be informed of the diagnosis and the severity of the sleep disordered breathing. 2. This study shows significant sleep fragmentation and abnormal sleep stage percentages; these are nonspecific findings and per se do not signify an intrinsic sleep disorder or a cause for the patient's sleep-related symptoms. Causes include (but are not limited to) the first night effect of the sleep study, circadian rhythm disturbances, medication effect or an underlying mood disorder or medical problem.  3. The patient should be cautioned not to drive, work at heights, or operate dangerous or heavy equipment when  tired or sleepy. Review and reiteration of good sleep hygiene measures should be pursued with any patient. 4. The patient will be seen in follow-up in the sleep clinic at [facility] for discussion of the test results, symptom and treatment compliance review, further management strategies, etc. The referring provider will be notified of the test results.   I certify that I have reviewed the entire raw data recording prior to the issuance of this report in accordance with the Standards of Accreditation of the American Academy of Sleep Medicine (AASM).   Star Age, MD, PhD Diplomat, ABPN (Neurology and Sleep)         Technical Report:   [facility] Split Summary    General Information  Name: DEANO, TOMASZEWSKI BMI: 64.40 Physician: Star Age, MD  ID: [id number] Height: 70.0 in Technician: Richard Miu, [place]  Sex: Male Weight: 221.0 lb Record: [id number]  Age: 22 [[birth year]] Date: 07/29/2022     Medical & Medication History    76 year old right-handed gentleman with an underlying medical history of paroxysmal A-fib, hypertension, hyperlipidemia, gout, history of kidney stones, prostate cancer and mild obesity, who reports snoring and some sleep disruption from nocturia. He reports that his snoring can be disturbing to his wife.  Zyloprim, Norvasc, Eliquis, Lipitor, Vitamin D, Folvite, Cozaar, Bystolic, Vitamin B 12, Flomax   Sleep Disorder      Comments   The patient came into the lab for a PSG. The patient was split due to having an AHI greater than 40 after 2 hours of total sleep time. The patient was fitted with a [employer] Simplus (FFM) size Med. The mask was a good fit to face, and the patient tolerate the mask well. CPAP was started at [facility] with no EPR. CPAP was increased to 10cmH2O with EPR of 2. The patient had one restroom break. EKG kept in NSR. Moderate to loud snoring. Increasing CPAP alleviated the snoring until the patient went into REM.  All sleep stages witnessed. Respiratory events scored with a 4% desat. Slept lateral and supine.    Baseline Sleep Stage Information Baseline start time: 09:28:52 PM Baseline end time: 01:51:20 AM   Time Total Supine Side Prone Upright  Recording 4h 22.59m2h 5.523mh 17.43m531m 0.43m 47m0.43m  33mep 2h 7.43m 0h22m.31m 1h 103m31m 0h 018m 0h 0.39m  Late16m N1 N2 N3 REM Onset Per. Slp. Eff.  Actual 0h 0.43m 0h 1.31m31m 9.31m 57m37.31m 69m12.31m 042m5.31m 48743m%   Stg68mr Wake N1 N2 N3 REM  Total 135.5 14.5 70.0 26.0 16.5  Supine 88.0 7.5 30.0 0.0 0.0  Side 47.5 7.0 40.0 26.0 16.5  Prone 0.0 0.0 0.0 0.0 0.0  Upright 0.0 0.0 0.0 0.0 0.0   Stg % Wake N1 N2 N3 REM  Total 51.6 11.4 55.1 20.5 13.0  Supine 33.5 5.9 23.6 0.0 0.0  Side 18.1 5.5 31.5 20.5 13.0  Prone 0.0 0.0 0.0 0.0 0.0  Upright 0.0 0.0 0.0 0.0 0.0    CPAP Sleep Stage Information CPAP start time: 01:51:20 AM CPAP end time: 05:02:39 AM   Time Total Supine Side Prone Upright  Recording (TRT) 3h 11.43m 0h 44.43m 2h 274mm 0h 0.42m0h 0.43m53mleep (42m) 1h 453mm 0h 0.43m 1h 43.31m 058m.43m 0h56m43m   La62mcy N1 83mN3 REM47mset Per. Slp. Eff.  Actual 0h 0.43m 0h 2.31m 0h 29.43m 1h 8.062mh 39.077mh 51.43m34m.19%  18mg Dur W8m N1 N2 72mREM  Total 87.5 7.5 37.0 37.0 22.0  Supine 44.0 0.0 0.0 0.0 0.0  Side 43.5 7.5 37.0 37.0 22.0  Prone 0.0 0.0 0.0 0.0 0.0  Upright 0.0 0.0 0.0 0.0 0.0   Stg % Wake N1 N2 N3 REM  Total 45.8 7.2 35.7 35.7 21.3  Supine 23.0 0.0 0.0 0.0 0.0  Side 22.8 7.2 35.7 35.7 21.3  Prone 0.0 0.0 0.0 0.0 0.0  Upright 0.0 0.0 0.0 0.0 0.0    Baseline Respiratory Information Apnea Summary Sub Supine Side Prone Upright  Total 14 Total '14 2 12 '$ 0 0    REM 9 0 9 0 0    NREM '5 2 3 '$ 0 0  Obs 14 REM 9 0 9 0 0    NREM '5 2 3 '$ 0 0  Mix 0 REM 0 0 0 0 0    NREM 0 0 0 0  0  Cen 0 REM 0 0 0 0 0    NREM 0 0 0 0 0   Rera Summary Sub Supine Side Prone Upright  Total 0 Total 0 0 0 0 0    REM 0 0 0 0 0    NREM 0 0 0 0 0   Hypopnea Summary Sub  Supine Side Prone Upright  Total 73 Total 73 37 36 0 0    REM 4 0 4 0 0    NREM 69 37 32 0 0   4% Hypopnea Summary Sub Supine Side Prone Upright  Total (4%) 69 Total 69 35 34 0 0    REM 4 0 4 0 0    NREM 65 35 30 0 0     AHI Total Obs Mix Cen  41.10 Apnea 6.61 6.61 0.00 0.00   Hypopnea 34.49 -- -- --  39.21 Hypopnea (4%) 32.60 -- -- --    Total Supine Side Prone Upright  Position AHI 41.10 62.40 32.18 0.00 0.00  REM AHI 47.27   NREM AHI 40.18   Position RDI 41.10 62.40 32.18 0.00 0.00  REM RDI 47.27   NREM RDI 40.18    4% Hypopnea Total Supine Side Prone Upright  Position AHI (4%) 39.21 59.20 30.84 0.00 0.00  REM AHI (4%) 47.27   NREM AHI (4%) 38.01   Position RDI (4%) 39.21 59.20 30.84 0.00 0.00  REM RDI (4%) 47.27   NREM RDI (4%) 38.01    CPAP Respiratory Information Apnea Summary Sub Supine Side Prone Upright  Total 1 Total 1 0 1 0 0    REM 0 0 0 0 0    NREM 1 0 1 0 0  Obs 0 REM 0 0 0 0 0    NREM 0 0 0 0 0  Mix 0 REM 0 0 0 0 0    NREM 0 0 0 0 0  Cen 1 REM 0 0 0 0 0    NREM 1 0 1 0 0   Rera Summary Sub Supine Side Prone Upright  Total 0 Total 0 0 0 0 0    REM 0 0 0 0 0    NREM 0 0 0 0 0   Hypopnea Summary Sub Supine Side Prone Upright  Total 7 Total 7 0 7 0 0    REM 4 0 4 0 0    NREM 3 0 3 0 0   4% Hypopnea Summary Sub Supine Side Prone Upright  Total (4%) 6 Total 6 0 6 0 0    REM 3 0 3 0 0    NREM 3 0 3 0 0     AHI Total Obs Mix Cen  4.64 Apnea 0.58 0.00 0.00 0.58   Hypopnea 4.06 -- -- --  4.06 Hypopnea (4%) 3.48 -- -- --    Total Supine Side Prone Upright  Position AHI 4.64 0.00 4.64 0.00 0.00  REM AHI 10.91   NREM AHI 2.94   Position RDI 4.64 0.00 4.64 0.00 0.00  REM RDI 10.91   NREM RDI 2.94    4% Hypopnea Total Supine Side Prone Upright  Position AHI (4%) 4.06 0.00 4.06 0.00 0.00  REM AHI (4%) 8.18   NREM AHI (4%) 2.94   Position RDI (4%) 4.06 0.00 4.06 0.00 0.00  REM RDI (4%) 8.18   NREM RDI (4%) 2.94    Desaturation Information  (Baseline)  <100% <90% <80% <70% <60% <50% <40%  Supine 51 12 0 0 0 0 0  Side 57 17 0 0 0 0 0  Prone 0 0 0 0 0 0 0  Upright 0 0 0 0 0 0 0  Total 108 29 0 0 0 0 0  Desaturation threshold setting: 3% Minimum desaturation setting: 10 seconds SaO2 nadir: 81% The longest event was a 97 sec obstructive Hypopneawith a minimum SaO2 of 90%. The lowest SaO2 was 82% associated with a 27 sec obstructive Apnea. Awakening/Arousal Information (Baseline) # of Awakenings 16  Wake after sleep onset 123.88m Wake after persistent sleep 109.5318m Arousal Assoc. Arousals Index  Apneas 5 2.4  Hypopneas 28 13.2  Leg Movements 0 0.0  Snore 0.0 0.0  PTT Arousals 0 0.0  Spontaneous 15 7.1  Total 48 22.7   Desaturation Information (CPAP)  <100% <90% <80% <70% <60% <50% <40%  Supine 14 1 0 0 0 0 0  Side 15 1 0 0 0 0 0  Prone 0 0 0 0 0 0 0  Upright 0 0 0 0 0 0 0  Total 29 2 0 0 0 0 0  Desaturation threshold setting: 3% Minimum desaturation setting: 10 seconds SaO2 nadir: 87% The longest event was a 59 sec obstructive Hypopnea with a minimum SaO2 of 90%. The lowest SaO2 was 89% associated with a 46 sec obstructive Hypopnea. Awakening/Arousal Information (CPAP) # of Awakenings 5  Wake after sleep onset 48.29m73make after persistent sleep 47.29m 26mrousal Assoc. Arousals Index  Apneas 0 0.0  Hypopneas 2 1.2  Leg Movements 0 0.0  Snore 0.0 0.0  PTT Arousals 0 0.0  Spontaneous 8 4.6  Total 10 5.8     EKG Rates (Baseline) EKG Avg Max Min  Awake 69 97 52  Asleep 63 81 47  EKG Events: Tachycardia Myoclonus Information (Baseline) PLMS LMs Index  Total LMs during PLMS 0 0.0  LMs w/ Microarousals 0 0.0   LM LMs Index  w/ Microarousal 0 0.0  w/ Awakening 0 0.0  w/ Resp Event 0 0.0  Spontaneous 0 0.0  Total 0 0.0   EKG Rates (CPAP) EKG Avg Max Min  Awake 68 98 60  Asleep 67 108 59  EKG Events: Tachycardia Myoclonus Information (CPAP) PLMS LMs Index  Total LMs during PLMS 0 0.0  LMs w/  Microarousals 0 0.0   LM LMs Index  w/ Microarousal 0 0.0  w/ Awakening 0 0.0  w/ Resp Event 0 0.0  Spontaneous 0 0.0  Total 0 0.0      Titration Table:  [facility] CPAP[facility] Report    General Information  Name: FeisYITZHAK, AWAN: 31 Physician: AthaStar Age: [id number]: 70 i63Technician: FielRichard Miux: Male Weight: 221 lb Record: [id number]: 76 [[id number]: 07/29/2022 Scorer: SheeVita Barleylds   Recommended Settings IPAP: N/A cmH20 EPAP: N/A cmH2O AHI: N/A AHI (4%): N/A   Pressure IPAP/EPAP 00 05 06 07 08 10   O2 Vol 0.0 0.0 0.0 0.0 0.0 0.0  Time TRT 263.18m 4718mm 1392m 15.27m42.5108m5.18m67mTST 3m.18m 6.18m 1249m 15429m 42.12m27.574mSlee318mtage929mWake 51.7 86.7 3.8 0.0 0.0 63.3   % REM 13.0 0.0 0.0 0.0 21.2 47.3   % N1 11.4 33.3 20.0 0.0 0.0 10.9   % N2 55.1 66.7 80.0 63.3 4.7 41.8   % N3 20.5 0.0 0.0 36.7 74.1 0.0  Respiratory Total  Events 87 1 2 0 4 1   Obs. Apn. 14 0 0 0 0 0   Mixed Apn. 0 0 0 0 0 0   Cen. Apn. 0 0 0 0 0 1   Hypopneas 73 1 2 0 4 0   AHI 41.10 10.00 9.60 0.00 5.65 2.18   Supine AHI 62.40 0.00 0.00 0.00 0.00 0.00   Prone AHI 0.00 0.00 0.00 0.00 0.00 0.00   Side AHI 32.18 10.00 9.60 0.00 5.65 2.18  Respiratory (4%) Hypopneas (4%) 69.00 1.00 2.00 0.00 3.00 0.00   AHI (4%) 39.21 10.00 9.60 0.00 4.24 2.18   Supine AHI (4%) 59.20 0.00 0.00 0.00 0.00 0.00   Prone AHI (4%) 0.00 0.00 0.00 0.00 0.00 0.00   Side AHI (4%) 30.84 10.00 9.60 0.00 4.24 2.18  Desat Profile <= 90% 12.54m0.145m.65m67m41m 75mm 026m   64m80% 4.47m 0.028m.41m547m41m 15mm 082m   165m70%29m47m 0.41m 0.41m641m41m 54mm 0765m   565m60%19m47m 5mm 0.41m 0.41m 965mm 029m  A947msal547mdex37mnea565m4 0.0 0.0 0.0 0.0 0.0   Hypopnea 13.2 10.0 4.8 0.0 0.0 0.0   LM 0.0 0.0 0.0 0.0 0.0 0.0   Spontaneous 7.1 0.0 9.6 0.0 1.4 10.9

## 2022-08-04 ENCOUNTER — Telehealth: Payer: Self-pay

## 2022-08-04 NOTE — Telephone Encounter (Signed)
I called patient to discuss.  No answer, left a voicemail asking him to call us back.  If patient calls back another day, please route call to POD 4.

## 2022-08-04 NOTE — Telephone Encounter (Signed)
-----   Message from Star Age, MD sent at 07/31/2022  6:37 PM EST ----- Patient referred by cardiology, seen by me on 05/29/2022, patient had a split night sleep study on 07/29/2022. Please call and notify patient that the recent sleep study showed severe obstructive sleep apnea (OSA). He did fairly well with CPAP with significant improvement of the respiratory events. I would like start the patient on a CPAP machine for home use. I placed the order in the chart.  Please advise patient that we will need a follow up appointment with either myself or one of our nurse practitioners in about 2-3 months post set-up to check for how they are doing on treatment and how well it's going with the machine in general. Most insurance company require a certain compliance percentage to continue to cover/pay for the machine. Please ask patient to schedule this FU appointment, according to the set-up date, which is the day they receive the machine. Please make sure, the patient understands the importance of keeping this window for the FU appointment, as it is often an Designer, industrial/product and not our rule. Failing to adhere to this may result in losing coverage for sleep apnea treatment, at which point some insurances require repeating the whole process. Plus, monitoring compliance data is usually good feedback for the patient as far as how they are doing, how many hours they are on it, how well the mask fits, etc.  Also remind patient, that any PAP machine or mask issues should be first addressed with the DME company, who provided the machine/supplies.  Please ask if patient has a preference regarding DME company, may depend on the insurance too.  Star Age, MD, PhD [facility])

## 2022-08-05 NOTE — Telephone Encounter (Signed)
I called pt. I had an extended conversation with him lasting 18 minutes. I advised pt that Dr. Rexene Alberts reviewed their sleep study results and found that pt has severe osa. Dr. Rexene Alberts recommends that pt start a CPAP at home. I reviewed PAP compliance expectations with the pt. Pt is agreeable to starting a CPAP. I advised pt that an order will be sent to a DME, [facility], and [facility] will call the pt within about one week after they file with the pt's insurance. [facility] will show the pt how to use the machine, fit for masks, and troubleshoot the CPAP if needed. A follow up appt was made for insurance purposes with Dr. Rexene Alberts on 10/15/22 at 9:45am. Pt verbalized understanding to arrive 15 minutes early and bring their CPAP.  Pt verbalized understanding of results. Pt had no questions at this time but was encouraged to call back if questions arise. I have sent the order to [facility] and have received confirmation that they have received the order.

## 2022-08-25 DIAGNOSIS — G4733 Obstructive sleep apnea (adult) (pediatric): Secondary | ICD-10-CM | POA: Diagnosis not present

## 2022-08-27 DIAGNOSIS — I10 Essential (primary) hypertension: Secondary | ICD-10-CM | POA: Diagnosis not present

## 2022-09-23 DIAGNOSIS — Z0000 Encounter for general adult medical examination without abnormal findings: Secondary | ICD-10-CM | POA: Diagnosis not present

## 2022-09-23 DIAGNOSIS — E559 Vitamin D deficiency, unspecified: Secondary | ICD-10-CM | POA: Diagnosis not present

## 2022-09-23 DIAGNOSIS — E7800 Pure hypercholesterolemia, unspecified: Secondary | ICD-10-CM | POA: Diagnosis not present

## 2022-09-25 DIAGNOSIS — G4733 Obstructive sleep apnea (adult) (pediatric): Secondary | ICD-10-CM | POA: Diagnosis not present

## 2022-09-27 DIAGNOSIS — I10 Essential (primary) hypertension: Secondary | ICD-10-CM | POA: Diagnosis not present

## 2022-09-29 DIAGNOSIS — E7800 Pure hypercholesterolemia, unspecified: Secondary | ICD-10-CM | POA: Diagnosis not present

## 2022-09-29 DIAGNOSIS — G4733 Obstructive sleep apnea (adult) (pediatric): Secondary | ICD-10-CM | POA: Diagnosis not present

## 2022-09-29 DIAGNOSIS — I10 Essential (primary) hypertension: Secondary | ICD-10-CM | POA: Diagnosis not present

## 2022-09-29 DIAGNOSIS — I480 Paroxysmal atrial fibrillation: Secondary | ICD-10-CM | POA: Diagnosis not present

## 2022-09-29 DIAGNOSIS — Z0000 Encounter for general adult medical examination without abnormal findings: Secondary | ICD-10-CM | POA: Diagnosis not present

## 2022-09-29 DIAGNOSIS — E559 Vitamin D deficiency, unspecified: Secondary | ICD-10-CM | POA: Diagnosis not present

## 2022-10-09 ENCOUNTER — Ambulatory Visit: Payer: Medicare Other | Admitting: Neurology

## 2022-10-13 DIAGNOSIS — D2372 Other benign neoplasm of skin of left lower limb, including hip: Secondary | ICD-10-CM | POA: Diagnosis not present

## 2022-10-13 DIAGNOSIS — L570 Actinic keratosis: Secondary | ICD-10-CM | POA: Diagnosis not present

## 2022-10-13 DIAGNOSIS — L813 Cafe au lait spots: Secondary | ICD-10-CM | POA: Diagnosis not present

## 2022-10-13 DIAGNOSIS — L814 Other melanin hyperpigmentation: Secondary | ICD-10-CM | POA: Diagnosis not present

## 2022-10-13 DIAGNOSIS — D1801 Hemangioma of skin and subcutaneous tissue: Secondary | ICD-10-CM | POA: Diagnosis not present

## 2022-10-13 DIAGNOSIS — L821 Other seborrheic keratosis: Secondary | ICD-10-CM | POA: Diagnosis not present

## 2022-10-15 ENCOUNTER — Encounter: Payer: Self-pay | Admitting: Neurology

## 2022-10-15 ENCOUNTER — Ambulatory Visit: Payer: Medicare Other | Admitting: Neurology

## 2022-10-15 VITALS — BP 141/64 | HR 64 | Ht 70.0 in | Wt 230.0 lb

## 2022-10-15 DIAGNOSIS — G4733 Obstructive sleep apnea (adult) (pediatric): Secondary | ICD-10-CM | POA: Diagnosis not present

## 2022-10-15 NOTE — Progress Notes (Signed)
Subjective:    Patient ID: Gregory Reid. is a 77 y.o. male.  HPI    Interim history:   Mr. Boardley is a 77 year old right-handed gentleman with an underlying medical history of paroxysmal A-fib, hypertension, hyperlipidemia, gout, history of kidney stones, prostate cancer and mild obesity, who presents for follow-up consultation of his obstructive sleep apnea after interim testing and starting CPAP therapy.  The patient is unaccompanied today. I first met him at the request of his cardiology PA on 05/29/2022, at which time he reported snoring and sleep disruption, particularly from nocturia.  He had been diagnosed with atrial fibrillation.  He was advised to proceed with a sleep study.  He had a laboratory attended sleep study which was conducted as an emergency split study due to severe sleep disordered breathing.  His split-night sleep study from 07/29/2022 showed a baseline AHI of 39.2/h, O2 nadir 81%.  During the titration portion of the study he did well with CPAP of 10 cm and I recommended home CPAP therapy.  His set up date was 12/24/2021.  He has a ResMed air sense 10 AutoSet machine.  His DME company is [employer].  Today, 10/15/2022: I reviewed his CPAP compliance data from 09/14/2022 through 10/13/2022, which is a total of 30 days, during which time he used his machine every night with percent use days greater than 4 hours at 97%, indicating excellent compliance with an average usage of 7 hours and 47 minutes, residual AHI at goal at 2/h, leak acceptable with the 95th percentile at 10.7 L/min on a pressure of 10 cm with EPR of 3.  He reports doing fairly well, he does not love his CPAP machine but is compliant, sometimes he takes it off in the very early morning hours and sleeps an hour or so without it.  He has noticed some improvements including no significant snoring per wife's feedback, significant improvement in his nocturia and his back pain seems to be a little bit less noticeable,  probably because he sleeps without tossing and turning as much.  He has a follow-up appointment pending with his cardiologist in about 2 months.  He uses nasal pillows, he is mindful about the cleanliness of his supplies, has not changed the insert of the nasal pillows interface yet and has not changed the filter yet.  He is reminded to talk to his DME provider about replacement supplies.  He uses the humidifier, does have occasional mouth dryness.  The patient's allergies, current medications, family history, past medical history, past social history, past surgical history and problem list were reviewed and updated as appropriate.   Previously:   05/29/22: (He) reports snoring and some sleep disruption from nocturia.  He reports that his snoring can be disturbing to his wife.  I reviewed your office note from 04/09/2022.  His Epworth sleepiness score is 1 out of 24, fatigue severity score is 21 out of 63.  He was recently found to be in atrial fibrillation, he was started immediately on Eliquis by PCP, he is wearing a Zio patch currently until next week.  He has a follow-up appointment with Dr. Einar Gip in about 2 weeks.  He has nocturia about twice per average night, sometimes only once, sometimes as many as 4 times.  He had prostate cancer treatment with seed implants some 4 years ago.  He has no family history of sleep apnea.  Bedtime varies, generally in bed somewhere between 8 PM and midnight.  Rise time depends on  what he has going on for the day, he likes to go fishing and goes deer hunting and gets up.  Average rise time is around 6:30 AM.  He denies recurrent morning headaches.  He drinks caffeine in the form of coffee and soda, 1 serving each on average.  He drinks very rarely.  He is a non-smoker.  He is retired and lives with his wife, they have no children, no pets in the household.  Weight has been more or less stable.  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  His blood pressure  has been fluctuating.  He is working on weight loss.    His Past Medical History Is Significant For: 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)    Hypertension    Nephrolithiasis    Prostate cancer [facility]) urologist-  dr dahlstedt/  oncologist- dr Tammi Klippel   dx 05/ 2019--- Stage T1c,  Gleason 3+4   Wears glasses     His Past Surgical History Is Significant For: Past Surgical History:  Procedure Laterality Date   colonscopy  10/14/2018   CYSTOSCOPY N/A 12/22/2018   Procedure: CYSTOSCOPY;  Surgeon: Franchot Gallo, MD;  Location: [facility];  Service: Urology;  Laterality: N/A;   CYSTOSCOPY WITH RETROGRADE PYELOGRAM, URETEROSCOPY AND STENT PLACEMENT Left 03/10/2022   Procedure: CYSTOSCOPY WITH RETROGRADE PYELOGRAM, URETEROSCOPY, STONE BASKETTING AND STENT PLACEMENT;  Surgeon: Franchot Gallo, MD;  Location: [facility];  Service: Urology;  Laterality: Left;   EXTRACORPOREAL SHOCK WAVE LITHOTRIPSY Left 07/27/2017   Procedure: LEFT EXTRACORPOREAL SHOCK WAVE LITHOTRIPSY (ESWL);  Surgeon: Festus Aloe, MD;  Location: [facility];  Service: Urology;  Laterality: Left;   EXTRACORPOREAL SHOCK WAVE LITHOTRIPSY Right 07/23/2020   Procedure: EXTRACORPOREAL SHOCK WAVE LITHOTRIPSY (ESWL);  Surgeon: Irine Seal, MD;  Location: [facility];  Service: Urology;  Laterality: Right;   FOOT SURGERY  1960s   removal foreign body,  per unsure which foot   LUMBAR LAMINECTOMY/DECOMPRESSION MICRODISCECTOMY Left 02/11/2017   Procedure: LEFT LUMBAR FOUR- [place],  MICRODISCECTOMY;  Surgeon: Jovita Gamma, MD;  Location: [facility];  Service: Neurosurgery;  Laterality: Left;   RADIOACTIVE SEED IMPLANT N/A 12/22/2018   Procedure: RADIOACTIVE SEED IMPLANT/BRACHYTHERAPY IMPLANT;  Surgeon: Franchot Gallo, MD;  Location: [facility];  Service: Urology;  Laterality: N/A;   SHOULDER ARTHROSCOPY Left 2009   SPACE OAR INSTILLATION N/A 12/22/2018   Procedure: SPACE OAR INSTILLATION;  Surgeon: Franchot Gallo, MD;  Location: [facility];  Service: Urology;  Laterality: N/A;    His Family History Is Significant For: Family History  Problem Relation Age of Onset   Heart disease Mother    Lung cancer Father 79       smoked approximately 40-45 years then stopped   Breast cancer Sister    Stroke Sister    Ovarian cancer Sister    Melanoma Brother    Prostate cancer Maternal Grandfather    Pancreatic cancer Neg Hx    Colon cancer Neg Hx     His Social History Is Significant For: Social History   Socioeconomic History   Marital status: Married    Spouse name: carol n Folden   Number of children: 0   Years of education: Not on file   Highest education level:  Not on file  Occupational History    Comment: retired  Tobacco Use   Smoking status: Never   Smokeless tobacco: Never  Vaping Use   Vaping Use: Never used  Substance and Sexual Activity   Alcohol use: Yes    Comment: rarely   Drug use: Never   Sexual activity: Not on file  Other Topics Concern   Not on file  Social History Narrative   Not on file   Social Determinants of Health   Financial Resource Strain: Not on file  Food Insecurity: Not on file  Transportation Needs: Not on file  Physical Activity: Not on file  Stress: Not on file  Social Connections: Not on file    His Allergies Are:  No Known Allergies:   His Current Medications Are:  Outpatient Encounter Medications as of 10/15/2022  Medication Sig   allopurinol (ZYLOPRIM) 300 MG tablet Take 300 mg by mouth daily.   amLODipine (NORVASC) 5 MG tablet Take 5 mg by mouth daily.   apixaban (ELIQUIS) 5 MG TABS tablet Take 1 tablet (5 mg total) by mouth 2 (two) times daily.   atorvastatin (LIPITOR) 20 MG tablet Take 20 mg by mouth daily.   cholecalciferol  (VITAMIN D) 1000 UNITS tablet Take 1,000 Units by mouth daily. Vitamin d  3   folic acid (FOLVITE) 353 MCG tablet Take 800 mcg by mouth.   losartan (COZAAR) 100 MG tablet Take 100 mg by mouth every evening. Increase to 100 mg from 75 mg 06/12/22   nebivolol (BYSTOLIC) 10 MG tablet Take 5 mg by mouth daily.   vitamin B-12 (CYANOCOBALAMIN) 1000 MCG tablet Take 1,000 mcg by mouth daily.   tamsulosin (FLOMAX) 0.4 MG CAPS capsule Take 0.4 mg by mouth daily.   No facility-administered encounter medications on file as of 10/15/2022.  :  Review of Systems:  Out of a complete 14 point review of systems, all are reviewed and negative with the exception of these symptoms as listed below:  Review of Systems  Neurological:        Pt here for CPAP f/u  Pt states uses CPAP 4+ hours nightly . Pt states wants to talk about benefits of using CPAP machine    ESS:0     Objective:  Neurological Exam  Physical Exam Physical Examination:   Vitals:   10/15/22 0935  BP: (!) 141/64  Pulse: 64    General Examination: The patient is a very pleasant 77 y.o. male in no acute distress. He appears well-developed and well-nourished and well groomed.   HEENT: Normocephalic, atraumatic, pupils are equal, round and reactive to light, extraocular tracking is good without limitation to gaze excursion or nystagmus noted. Hearing is grossly intact. Face is symmetric with normal facial animation. Speech is clear with no dysarthria noted. There is no hypophonia. There is no lip, neck/head, jaw or voice tremor. Neck is supple with full range of passive and active motion. There are no carotid bruits on auscultation. Oropharynx exam reveals: mild mouth dryness, adequate dental hygiene and moderate airway crowding, due to tonsillar size of about 1+, redundant soft palate and longer uvula.  Mallampati class II. Tongue protrudes centrally and palate elevates symmetrically.  Neck circumference of 18-1/2 inches, no significant  overbite.     Chest: Clear to auscultation without wheezing, rhonchi or crackles noted.    Heart: S1+S2+0, regular and normal without murmurs, rubs or gallops noted.    Abdomen: Soft, non-tender and non-distended.   Extremities: There is no  edema in the distal lower extremities bilaterally.    Skin: Warm and dry without trophic changes noted.    Musculoskeletal: exam reveals no obvious joint deformities.    Neurologically:  Mental status: The patient is awake, alert and oriented in all 4 spheres. His immediate and remote memory, attention, language skills and fund of knowledge are appropriate. There is no evidence of aphasia, agnosia, apraxia or anomia. Speech is clear with normal prosody and enunciation. Thought process is linear. Mood is normal and affect is normal.  Cranial nerves II - XII are as described above under HEENT exam.  Motor exam: Normal bulk, strength and tone is noted. There is no obvious tremor. Fine motor skills and coordination: grossly intact.  Cerebellar testing: No dysmetria or intention tremor. There is no truncal or gait ataxia.  Sensory exam: intact to light touch in the upper and lower extremities.  Gait, station and balance: He stands easily. No veering to one side is noted. No leaning to one side is noted. Posture is age-appropriate and stance is narrow based. Gait shows normal stride length and normal pace. No problems turning are noted.    Assessment and Plan:  In summary, Gregory Reid. is a very pleasant 77 year old male  with an underlying medical history of paroxysmal A-fib, hypertension, hyperlipidemia, gout, history of kidney stones, prostate cancer and mild obesity, who presents for follow-up consultation of his obstructive sleep apnea after interim testing and starting CPAP therapy. His split-night sleep study from 07/29/2022 showed a baseline AHI of 39.2/h, O2 nadir 81%.  During the titration portion of the study he did well with CPAP of 10 cm and  I recommended home CPAP therapy.  His set up date was 12/24/2021.  He has a ResMed air sense 10 AutoSet machine.  His DME company is [employer].  He is compliant with treatment.  His apnea scores look good, we discussed his sleep study results and his compliance data in detail, he has noticed some improvement particularly with respect to sleep consolidation, less restlessness, and less nocturia.  He is highly commended for his treatment adherence.  He is motivated to continue.  He is advised to be mindful about the cleanliness of his supplies and also replacing filter and nasal pillows inserted on a regular basis.  He is at this juncture advised to follow-up in our sleep clinic yearly, he can see our nurse practitioner next time.  I answered all his questions today and he was in agreement with our plan. I spent 30 minutes in total face-to-face time and in reviewing records during pre-charting, more than 50% of which was spent in counseling and coordination of care, reviewing test results, reviewing medications and treatment regimen and/or in discussing or reviewing the diagnosis of OSA, the prognosis and treatment options. Pertinent laboratory and imaging test results that were available during this visit with the patient were reviewed by me and considered in my medical decision making (see chart for details).

## 2022-10-15 NOTE — Patient Instructions (Addendum)
It was nice to see you again today. I am glad to hear, things are going well with your CPAP therapy. You have adjusted well to treatment with your new machine, and you are compliant with it. You have also fulfilled the insurance-mandated compliance percentage, which is reassuring, so you can get ongoing supplies through your insurance. Please talk to your DME provider about getting replacement supplies on a regular basis. Please be sure to change your filter every month, your mask about every 3 months (nasal pillow inserts monthly), hose about every 6 months, humidifier chamber about yearly. Some restrictions are imposed by your insurance carrier with regard to how frequently you can get certain supplies.  Your DME company can provide further details if necessary.    Please continue using your CPAP regularly. While your insurance requires that you use CPAP at least 4 hours each night on 70% of the nights, I recommend, that you not skip any nights and use it throughout the night if you can. Getting used to CPAP and staying with the treatment long term does take time and patience and discipline. Untreated obstructive sleep apnea when it is moderate to severe can have an adverse impact on cardiovascular health and raise her risk for heart disease, arrhythmias, hypertension, congestive heart failure, stroke and diabetes. Untreated obstructive sleep apnea causes sleep disruption, nonrestorative sleep, and sleep deprivation. This can have an impact on your day to day functioning and cause daytime sleepiness and impairment of cognitive function, memory loss, mood disturbance, and problems focussing. Using CPAP regularly can improve these symptoms. We can see you in 1 year, you can see one of our nurse practitioners as you are stable.

## 2022-10-21 DIAGNOSIS — G4733 Obstructive sleep apnea (adult) (pediatric): Secondary | ICD-10-CM | POA: Diagnosis not present

## 2022-10-23 ENCOUNTER — Encounter: Payer: Self-pay | Admitting: Neurology

## 2022-10-26 DIAGNOSIS — G4733 Obstructive sleep apnea (adult) (pediatric): Secondary | ICD-10-CM | POA: Diagnosis not present

## 2022-10-28 DIAGNOSIS — I10 Essential (primary) hypertension: Secondary | ICD-10-CM | POA: Diagnosis not present

## 2022-11-27 DIAGNOSIS — I10 Essential (primary) hypertension: Secondary | ICD-10-CM | POA: Diagnosis not present

## 2022-12-11 ENCOUNTER — Ambulatory Visit: Payer: Medicare Other | Admitting: Cardiology

## 2022-12-12 DIAGNOSIS — G4733 Obstructive sleep apnea (adult) (pediatric): Secondary | ICD-10-CM | POA: Diagnosis not present

## 2022-12-17 ENCOUNTER — Encounter: Payer: Self-pay | Admitting: Cardiology

## 2022-12-17 ENCOUNTER — Telehealth: Payer: Self-pay

## 2022-12-17 ENCOUNTER — Ambulatory Visit: Payer: Medicare Other | Admitting: Cardiology

## 2022-12-17 VITALS — BP 156/78 | HR 63 | Ht 70.0 in | Wt 237.0 lb

## 2022-12-17 DIAGNOSIS — I480 Paroxysmal atrial fibrillation: Secondary | ICD-10-CM

## 2022-12-17 DIAGNOSIS — G4733 Obstructive sleep apnea (adult) (pediatric): Secondary | ICD-10-CM | POA: Diagnosis not present

## 2022-12-17 DIAGNOSIS — E7800 Pure hypercholesterolemia, unspecified: Secondary | ICD-10-CM

## 2022-12-17 DIAGNOSIS — I10 Essential (primary) hypertension: Secondary | ICD-10-CM | POA: Diagnosis not present

## 2022-12-17 MED ORDER — TRIAMTERENE-HCTZ 37.5-25 MG PO TABS: 1.0000 | ORAL_TABLET | Freq: Every day | ORAL | 2 refills | Status: DC

## 2022-12-17 NOTE — Telephone Encounter (Signed)
Patient home blood pressure is fairly controlled on current antihypertensive regimen. Patient has noted that he feels he goes into afib for a few days about once a month.  Systolic Blood Pressure mmHg -- 138.7 (99991111 - 0000000) Diastolic Blood Pressure mmHg -- 76.7 (62.0 - 99.0) Heart Rate bpm -- 64.9 (40.0 - 75.0)  12/16/22 12:54 PM 124 / 63 mmHg 62 bpm  12/16/22 12:51 PM 135 / 69 mmHg 56 bpm  12/16/22 12:48 PM 140 / 70 mmHg 59 bpm  12/15/22 7:22 AM 146 / 83 mmHg 58 bpm  12/14/22 8:15 AM 141 / 79 mmHg 61 bpm  12/13/22 10:06 PM 142 / 77 mmHg 72 bpm  12/13/22 10:03 PM 155 / 86 mmHg 75 bpm  12/13/22 10:01 PM 141 / 74 mmHg 58 bpm

## 2022-12-17 NOTE — Patient Instructions (Addendum)
After you start the new medication, Maxide 2 to 3 weeks later please go to [facility] to get blood work done.

## 2022-12-17 NOTE — Progress Notes (Signed)
Primary Physician/Referring:  Holland Commons, FNP  Patient ID: Gregory Reid., male    DOB: [birth year], 77 y.o.   MRN: [id number]  Chief Complaint  Patient presents with   Paroxysmal atrial fibrillation [facility])   Follow-up   HPI:    Gregory Reid.  is a 76 y.o. Caucasian male with history of hypertension, hyperlipidemia, fairly active person otherwise no significant cardiovascular issues in the past, seen by [place] on 04/09/2022, after he was found to be in atrial fibrillation PCP office on urgent basis, patient had irregular heartbeat by blood pressure monitor at home 2 to 3 days prior but otherwise asymptomatic.    He keeps himself active, denies dyspnea, chest pain, dizziness or syncope.  States that since last office visit a year ago, he has had at least 3 or 4 episodes of atrial fibrillation that usually last between less than 1 to 1.5 days.  States that he remains asymptomatic but he can feel skipped beats.  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)    Hypertension    Nephrolithiasis    OSA on CPAP    Pro[facility] urologist-  dr dahlstedt/  oncologist- dr Tammi Klippel   dx 05/ 2019--- Stage T1c,  Gleason 3+4   Wears glasses    Past Surgical History:  Procedure Laterality Date   colonscopy  10/14/2018   CYSTOSCOPY N/A 12/22/2018   Procedure: CYSTOSCOPY;  Surgeon: Franchot Gallo, MD;  Location: [facility];  Service: Urology;  Laterality: N/A;   CYSTOSCOPY WITH RETROGRADE PYELOGRAM, URETEROSCOPY AND STENT PLACEMENT Left 03/10/2022   Procedure: CYSTOSCOPY WITH RETROGRADE PYELOGRAM, URETEROSCOPY, STONE BASKETTING AND STENT PLACEMENT;  Surgeon: Franchot Gallo, MD;  Location: [facility];  Service: Urology;  Laterality: Left;   EXTRACORPOREAL SHOCK WAVE LITHOTRIPSY Left 07/27/2017    Procedure: LEFT EXTRACORPOREAL SHOCK WAVE LITHOTRIPSY (ESWL);  Surgeon: Festus Aloe, MD;  Location: [facility];  Service: Urology;  Laterality: Left;   EXTRACORPOREAL SHOCK WAVE LITHOTRIPSY Right 07/23/2020   Procedure: EXTRACORPOREAL SHOCK WAVE LITHOTRIPSY (ESWL);  Surgeon: Irine Seal, MD;  Location: [facility];  Service: Urology;  Laterality: Right;   FOOT SURGERY  1960s   removal foreign body,  per unsure which foot   LUMBAR LAMINECTOMY/DECOMPRESSION MICRODISCECTOMY Left 02/11/2017   Procedure: LEFT LUMBAR FOUR- [place],  MICRODISCECTOMY;  Surgeon: Jovita Gamma, MD;  Location: [place];  Service: Neurosurgery;  Laterality: Left;   RADIOACTIVE SEED IMPLANT N/A 12/22/2018   Procedure: RADIOACTIVE SEED IMPLANT/BRACHYTHERAPY IMPLANT;  Surgeon: Franchot Gallo, MD;  Location: [facility];  Service: Urology;  Laterality: N/A;   SHOULDER ARTHROSCOPY Left 2009   SPACE OAR INSTILLATION N/A 12/22/2018   Procedure: SPACE OAR INSTILLATION;  Surgeon: Franchot Gallo, MD;  Location: [facility];  Service: Urology;  Laterality: N/A;   Family History  Problem Relation Age of Onset   Heart disease Mother    Lung cancer Father 4       smoked approximately 40-45 years then stopped   Breast cancer Sister    Stroke Sister    Ovarian cancer Sister    Melanoma Brother    Prostate cancer Maternal Grandfather    Pancreatic cancer Neg Hx    Colon cancer Neg Hx     Social History  Tobacco Use   Smoking status: Never   Smokeless tobacco: Never  Substance Use Topics   Alcohol use: Yes    Comment: rarely   Marital Status: Married   ROS  Review of Systems  Cardiovascular:  Positive for leg swelling and palpitations. Negative for chest pain and dyspnea on exertion.    Objective  Blood pressure (!) 156/78, pulse 63, height 5\' 10"  (1.778 m), weight 237 lb (107.5 kg), SpO2 97 %.     12/17/2022   10:44 AM 12/17/2022   10:39 AM 10/15/2022    9:35 AM  Vitals  with BMI  Height  5\' 10"  5\' 10"   Weight  237 lbs 230 lbs  BMI  [id number]  Systolic [id number]  Diastolic 78 77 64  Pulse 63 64 64      Physical Exam Neck:     Vascular: Carotid bruit (bilateral, R>L) present. No JVD.  Cardiovascular:     Rate and Rhythm: Normal rate and regular rhythm.     Pulses: Intact distal pulses.     Heart sounds: Normal heart sounds. No murmur heard.    No gallop.  Pulmonary:     Effort: Pulmonary effort is normal.     Breath sounds: Normal breath sounds.  Abdominal:     General: Bowel sounds are normal.     Palpations: Abdomen is soft.  Musculoskeletal:     Right lower leg: Edema (2+ pitting ankle) present.     Left lower leg: Edema (2+ pitting ankle) present.    Laboratory examination:   Recent Labs    03/10/22 0827  NA 143  K 4.7  CL 105  GLUCOSE 101*  BUN 27*  CREATININE 1.30*      Latest Ref Rng & Units 03/10/2022    8:27 AM 12/15/2018    9:32 AM 02/09/2017    9:27 AM  CMP  Glucose 70 - 99 mg/dL 101  98  96   BUN 8 - 23 mg/dL 27  17  15    Creatinine 0.61 - 1.24 mg/dL 1.30  1.11  1.27   Sodium 135 - 145 mmol/L 143  140  138   Potassium 3.5 - 5.1 mmol/L 4.7  4.5  4.0   Chloride 98 - 111 mmol/L 105  105  104   CO2 22 - 32 mmol/L  24  25   Calcium 8.9 - 10.3 mg/dL  9.2  9.1   Total Protein 6.5 - 8.1 g/dL  7.3    Total Bilirubin 0.3 - 1.2 mg/dL  0.7    Alkaline Phos 38 - 126 U/L  82    AST 15 - 41 U/L  17    ALT 0 - 44 U/L  26        Latest Ref Rng & Units 03/10/2022    8:27 AM 12/15/2018    9:32 AM 02/09/2017    9:27 AM  CBC  WBC 4.0 - 10.5 K/uL  5.3  7.4   Hemoglobin 13.0 - 17.0 g/dL 14.3  15.4  14.9   Hematocrit 39.0 - 52.0 % 42.0  46.4  44.0   Platelets 150 - 400 K/uL  181  174     External labs:   Cholesterol, total 169.000 m 03/28/2021 HDL 41.000 mg 03/28/2021 LDL 96.000 mg 09/25/2020 Triglycerides 150.000 m 09/23/2022  Hemoglobin 14.300 g/d 03/10/2022  Creatinine, Serum 1.300 mg/ 03/10/2022 CrCl Est 49.91 03/10/2022 eGFR  63.000 mL/min/1.80m2 09/23/2022  Potassium 4.700 mm 03/10/2022 ALT (SGPT) 16.000 IU/  09/25/2020  TSH 1.130 09/23/2022    Radiology:   No results found.  Cardiac Studies:   PCV MYOCARDIAL PERFUSION WO LEXISCAN 05/19/2022  Narrative Exercise nuclear stress test 05/19/22 Myocardial perfusion is normal. Some diaphragmatic attenuation noted. Overall LV systolic function is normal without regional wall motion abnormalities. Stress LV EF: 58%. Low risk study. Normal ECG stress. The patient exercised for 6 minutes and 38 seconds of a Bruce protocol, achieving approximately 8.01 METs and 87% MPHR. The heart rate response was normal.  The blood pressure response was normal. No previous exam available for comparison.   PCV ECHOCARDIOGRAM COMPLETE 05/14/2022  Narrative Echocardiogram 05/14/2022: Normal LV systolic function with visual EF 65-70%. Left ventricle cavity is normal in size. Normal left ventricular wall thickness. Normal global wall motion. Doppler evidence of grade I (impaired) diastolic dysfunction, normal LAP. Calculated EF 73%. Structurally normal tricuspid valve.  Mild tricuspid regurgitation. No evidence of pulmonary hypertension. RVSP measures 33 mmHg. no prior available for comparison  Zio Patch Extended out patient EKG monitoring 11 days starting 05/19/2022:  Predominant rhythm is normal sinus rhythm.  Minimum heart rate 41, maximum heart rate 103 bpm.  There were 22 atrial tachycardia episodes longest lasting 12 seconds at the rate of 123 bpm.  There are other isolated PACs and atrial couplets and rare triplets. There are occasional PVCs, 2.2% burden and occasional ventricular couplets.  Occasional ventricular bigeminy and trigeminy were present. There were no patient activated events. There is no atrial fibrillation, there was no heart block.    Carotid artery duplex 05/14/2022: Duplex suggests stenosis in the right internal carotid artery (1-15%). Duplex suggests stenosis in  the right external carotid artery (<50%). No evidence of significant stenosis in the left internal carotid artery. Duplex suggests stenosis in the left external carotid artery (<50%). There is very mild plaque noted in the bilateral carotid arteries. Antegrade right vertebral artery flow. Antegrade left vertebral artery flow. External carotid artery stenosis probably source of bruit.    EKG:   EKG 12/17/2022: Normal sinus rhythm with rate of 61 bpm, normal axis.  Single PAC. No evidence of ischemia, normal EKG.  Compared to 04/09/2022, no significant change.  Allergies  No Known Allergies  Medications   Current Outpatient Medications:    allopurinol (ZYLOPRIM) 300 MG tablet, Take 300 mg by mouth daily., Disp: , Rfl:    amLODipine (NORVASC) 5 MG tablet, Take 5 mg by mouth daily., Disp: , Rfl:    apixaban (ELIQUIS) 5 MG TABS tablet, Take 1 tablet (5 mg total) by mouth 2 (two) times daily., Disp: 60 tablet, Rfl: 3   atorvastatin (LIPITOR) 20 MG tablet, Take 20 mg by mouth daily., Disp: , Rfl:    cholecalciferol (VITAMIN D) 1000 UNITS tablet, Take 1,000 Units by mouth daily. Vitamin d  3, Disp: , Rfl:    folic acid (FOLVITE) Q000111Q MCG tablet, Take 800 mcg by mouth., Disp: , Rfl:    losartan (COZAAR) 100 MG tablet, Take 100 mg by mouth every evening. Increase to 100 mg from 75 mg 06/12/22, Disp: , Rfl:    nebivolol (BYSTOLIC) 10 MG tablet, Take 5 mg by mouth daily., Disp: , Rfl:    triamterene-hydrochlorothiazide (MAXZIDE-25) 37.5-25 MG tablet, Take 1 tablet by mouth daily., Disp: 30 tablet, Rfl: 2   vitamin B-12 (CYANOCOBALAMIN) 1000 MCG tablet, Take 1,000 mcg by mouth daily., Disp: , Rfl:    Assessment     ICD-10-CM   1. Paroxysmal atrial fibrillation (HCC)  I48.0 EKG 12-Lead  2. Primary hypertension  I10 triamterene-hydrochlorothiazide (MAXZIDE-25) 37.5-25 MG tablet    Basic metabolic panel    3. Pure hypercholesterolemia  E78.00     4. OSA on CPAP Severe OSA 08/04/2022  G47.33         Medications Discontinued During This Encounter  Medication Reason   tamsulosin (FLOMAX) 0.4 MG CAPS capsule Completed Course     Meds ordered this encounter  Medications   triamterene-hydrochlorothiazide (MAXZIDE-25) 37.5-25 MG tablet    Sig: Take 1 tablet by mouth daily.    Dispense:  30 tablet    Refill:  2   This patients CHA2DS2-VASc Score 3 (HTN, Age) and yearly risk of stroke 3.2%.   Recommendations:   Gregory Reid. is a 77 y.o.  Caucasian male with history of hypertension, hyperlipidemia, fairly active person otherwise no significant cardiovascular issues in the past, seen by [place] on 04/09/2022, after he was found to be in atrial fibrillation PCP office on urgent basis, patient had irregular heartbeat by blood pressure monitor at home 2 to 3 days prior but otherwise asymptomatic.   1. Paroxysmal atrial fibrillation [facility]) Patient with paroxysmal atrial fibrillation, he is presently maintaining sinus rhythm but continues to have at least 2-3 episodes of breakthrough atrial fibrillation at most last 1 to 1.5 days..  For now we will continue observation, he could certainly be a good candidate for flecainide as well.  He has no known coronary disease and no heart failure. - EKG 12-Lead  2. Primary hypertension Blood pressure is very well-controlled at home but still in higher than 130 mmHg range.  I will add Maxide 37.5/25 mg in the morning and check BMP in 2 weeks to 3 weeks.  He has developed mild bilateral 2+ ankle edema related to amlodipine.  Hence will not increase the dose of amlodipine from 5 mg.  Salt restriction and fluid restriction discussed with the patient.  He has gained some weight as well, changing his diet and restricting his fluid will certainly help with blood pressure control as well. - triamterene-hydrochlorothiazide (MAXZIDE-25) 37.5-25 MG tablet; Take 1 tablet by mouth daily.  Dispense: 30 tablet; Refill: 2 - Basic metabolic panel  3. Pure  hypercholesterolemia Lipids under excellent control, external labs reviewed.  4. OSA on CPAP Severe OSA 08/04/2022 He has not been diagnosed with OSA since November 2023, he is compliant with the use of the CPAP.  Hopefully this will also help to reduce the incidence of atrial fibrillation and also get better blood pressure control.  I would like to see him back in 6 months for follow-up.  He does have bilateral carotid artery bruit, carotid artery duplex revealing very minimal plaque.RPM Patient: August BP DATA  Patient home blood pressure is fairly controlled on current antihypertensive regimen. Patient has noted that he feels he goes into afib for a few days about once a month.   Systolic Blood Pressure      mmHg  --          138.7 (99991111 - 0000000) Diastolic Blood Pressure     mmHg  --          76.7 (62.0 - 99.0) Heart Rate      bpm     --          64.9 (40.0 - 75.0)   12/16/22 12:54 PM       124      /           63  mmHg  62        bpm      12/16/22 12:51 PM       135      /           69        mmHg  56        bpm      12/16/22 12:48 PM       140      /           70        mmHg  59        bpm      12/15/22 7:22 AM         146      /           83        mmHg  58        bpm      12/14/22 8:15 AM         141      /           79        mmHg  61        bpm      12/13/22 10:06 PM       142      /           77        mmHg  72        bpm      12/13/22 10:03 PM       155      /           86        mmHg  75        bpm      12/13/22 10:01 PM       141      /           74        mmHg  58        bpm   Adrian Prows, MD, [facility] 12/17/2022, 11:51 AM Office: [phone] Fax: [phone] Pager: [phone]

## 2022-12-25 DIAGNOSIS — G4733 Obstructive sleep apnea (adult) (pediatric): Secondary | ICD-10-CM | POA: Diagnosis not present

## 2022-12-27 DIAGNOSIS — I10 Essential (primary) hypertension: Secondary | ICD-10-CM | POA: Diagnosis not present

## 2023-01-07 DIAGNOSIS — I10 Essential (primary) hypertension: Secondary | ICD-10-CM | POA: Diagnosis not present

## 2023-01-08 LAB — BASIC METABOLIC PANEL
BUN/Creatinine Ratio: 18 (ref 10–24)
BUN: 25 mg/dL (ref 8–27)
CO2: 24 mmol/L (ref 20–29)
Calcium: 9.3 mg/dL (ref 8.6–10.2)
Chloride: 101 mmol/L (ref 96–106)
Creatinine, Ser: 1.36 mg/dL — ABNORMAL HIGH (ref 0.76–1.27)
Glucose: 93 mg/dL (ref 70–99)
Potassium: 4.4 mmol/L (ref 3.5–5.2)
Sodium: 139 mmol/L (ref 134–144)
eGFR: 54 mL/min/{1.73_m2} — ABNORMAL LOW (ref >59)

## 2023-01-24 DIAGNOSIS — G4733 Obstructive sleep apnea (adult) (pediatric): Secondary | ICD-10-CM | POA: Diagnosis not present

## 2023-01-26 DIAGNOSIS — I10 Essential (primary) hypertension: Secondary | ICD-10-CM | POA: Diagnosis not present

## 2023-02-24 DIAGNOSIS — G4733 Obstructive sleep apnea (adult) (pediatric): Secondary | ICD-10-CM | POA: Diagnosis not present

## 2023-02-25 DIAGNOSIS — I10 Essential (primary) hypertension: Secondary | ICD-10-CM | POA: Diagnosis not present

## 2023-03-09 ENCOUNTER — Other Ambulatory Visit: Payer: Self-pay

## 2023-03-09 DIAGNOSIS — I10 Essential (primary) hypertension: Secondary | ICD-10-CM

## 2023-03-09 MED ORDER — TRIAMTERENE-HCTZ 37.5-25 MG PO TABS: 1.0000 | ORAL_TABLET | Freq: Every day | ORAL | 3 refills | Status: DC

## 2023-03-13 DIAGNOSIS — G4733 Obstructive sleep apnea (adult) (pediatric): Secondary | ICD-10-CM | POA: Diagnosis not present

## 2023-03-26 DIAGNOSIS — G4733 Obstructive sleep apnea (adult) (pediatric): Secondary | ICD-10-CM | POA: Diagnosis not present

## 2023-03-30 DIAGNOSIS — E559 Vitamin D deficiency, unspecified: Secondary | ICD-10-CM | POA: Diagnosis not present

## 2023-03-30 DIAGNOSIS — E7800 Pure hypercholesterolemia, unspecified: Secondary | ICD-10-CM | POA: Diagnosis not present

## 2023-04-08 DIAGNOSIS — E7800 Pure hypercholesterolemia, unspecified: Secondary | ICD-10-CM | POA: Diagnosis not present

## 2023-04-08 DIAGNOSIS — I10 Essential (primary) hypertension: Secondary | ICD-10-CM | POA: Diagnosis not present

## 2023-04-08 DIAGNOSIS — Z7901 Long term (current) use of anticoagulants: Secondary | ICD-10-CM | POA: Diagnosis not present

## 2023-04-08 DIAGNOSIS — R7989 Other specified abnormal findings of blood chemistry: Secondary | ICD-10-CM | POA: Diagnosis not present

## 2023-04-08 DIAGNOSIS — E559 Vitamin D deficiency, unspecified: Secondary | ICD-10-CM | POA: Diagnosis not present

## 2023-04-08 DIAGNOSIS — I480 Paroxysmal atrial fibrillation: Secondary | ICD-10-CM | POA: Diagnosis not present

## 2023-04-10 ENCOUNTER — Encounter: Payer: Self-pay | Admitting: Cardiology

## 2023-04-11 NOTE — Progress Notes (Signed)
Labs 03/30/2023:  Serum glucose 93 mg, BUN 25, creatinine 1.22, EGFR 56,, potassium 4.4, LFTs normal.  Total cholesterol 161, triglycerides 178, HDL 38, LDL 92.  Vitamin D 30.2, PSA normal at 0.03, TSH 1.13.  Hb 13.6/HCT 41.0, platelets 212.

## 2023-04-16 DIAGNOSIS — R7989 Other specified abnormal findings of blood chemistry: Secondary | ICD-10-CM | POA: Diagnosis not present

## 2023-04-23 DIAGNOSIS — R7989 Other specified abnormal findings of blood chemistry: Secondary | ICD-10-CM | POA: Diagnosis not present

## 2023-04-23 DIAGNOSIS — I10 Essential (primary) hypertension: Secondary | ICD-10-CM | POA: Diagnosis not present

## 2023-04-26 DIAGNOSIS — G4733 Obstructive sleep apnea (adult) (pediatric): Secondary | ICD-10-CM | POA: Diagnosis not present

## 2023-04-29 ENCOUNTER — Encounter: Payer: Self-pay | Admitting: Cardiology

## 2023-04-29 NOTE — Progress Notes (Signed)
Labs 02/22/2023:  Serum glucose 90 mg, BUN 26, creatinine 1.59, EGFR 44 mL, potassium 4.4. Stable renal function

## 2023-06-10 ENCOUNTER — Emergency Department (HOSPITAL_BASED_OUTPATIENT_CLINIC_OR_DEPARTMENT_OTHER): Payer: Medicare Other

## 2023-06-10 ENCOUNTER — Emergency Department (HOSPITAL_BASED_OUTPATIENT_CLINIC_OR_DEPARTMENT_OTHER)
Admission: EM | Admit: 2023-06-10 | Discharge: 2023-06-10 | Disposition: A | Discharge status: Home / Self Care | Payer: Medicare Other

## 2023-06-10 ENCOUNTER — Encounter (HOSPITAL_BASED_OUTPATIENT_CLINIC_OR_DEPARTMENT_OTHER): Payer: Self-pay | Admitting: Emergency Medicine

## 2023-06-10 ENCOUNTER — Other Ambulatory Visit: Payer: Self-pay

## 2023-06-10 DIAGNOSIS — Z79899 Other long term (current) drug therapy: Secondary | ICD-10-CM | POA: Insufficient documentation

## 2023-06-10 DIAGNOSIS — I10 Essential (primary) hypertension: Secondary | ICD-10-CM | POA: Diagnosis not present

## 2023-06-10 DIAGNOSIS — M5412 Radiculopathy, cervical region: Secondary | ICD-10-CM | POA: Insufficient documentation

## 2023-06-10 DIAGNOSIS — M47812 Spondylosis without myelopathy or radiculopathy, cervical region: Secondary | ICD-10-CM | POA: Diagnosis not present

## 2023-06-10 DIAGNOSIS — Z7901 Long term (current) use of anticoagulants: Secondary | ICD-10-CM | POA: Diagnosis not present

## 2023-06-10 DIAGNOSIS — M4802 Spinal stenosis, cervical region: Secondary | ICD-10-CM | POA: Diagnosis not present

## 2023-06-10 DIAGNOSIS — I4891 Unspecified atrial fibrillation: Secondary | ICD-10-CM | POA: Diagnosis not present

## 2023-06-10 DIAGNOSIS — M542 Cervicalgia: Secondary | ICD-10-CM | POA: Diagnosis not present

## 2023-06-10 NOTE — ED Notes (Signed)
Patient verbalizes understanding of discharge instructions. Opportunity for questioning and answers were provided. Patient discharged from ED.  °

## 2023-06-10 NOTE — Telephone Encounter (Signed)
This encounter was created in error - please disregard.

## 2023-06-10 NOTE — ED Triage Notes (Signed)
Pt arrived POV from home, caox4, ambulatory c/o neck pain and stiffness. Pt reports he woke up approx 1 wk ago with bilateral neck pain, more so on the L, but that the pain and stiffness has increased over the past week and is now radiating down to R and L shoulder with intermittent tingling in L arm. PMH bone spur in lower spine, denies hx with cervical spine. Pt denies any recent fall/trauma.

## 2023-06-10 NOTE — ED Provider Notes (Signed)
[place] EMERGENCY DEPARTMENT AT [facility] Provider Note   CSN: [id number] Arrival date & time: 06/10/23  4098     History  Chief Complaint  Patient presents with   Torticollis    Gregory Reid. is a 77 y.o. male.  77 year old male with past medical history of atrial fibrillation on Eliquis and hypertension presenting to the emergency department today with neck pain.  The patient states that he woke up a week ago and was having pain to the left side of his neck.  He states that this is been relatively persistent.  He started having some tingling to his left arm as well as some pain radiating down his left arm.  He called his primary care doctor and was told to come to the ER because he is planning on going out of town over the weekend.  He denies any fevers.  Denies any recent injuries.  He denies any bowel or bladder dysfunction or saddle anesthesia.  He came to the ER today further evaluation.  Denies any associated chest pain.        Home Medications Prior to Admission medications   Medication Sig Start Date End Date Taking? Authorizing Provider  allopurinol (ZYLOPRIM) 300 MG tablet Take 300 mg by mouth daily.    [provider]  amLODipine (NORVASC) 5 MG tablet Take 5 mg by mouth daily.    [provider]  apixaban (ELIQUIS) 5 MG TABS tablet Take 1 tablet (5 mg total) by mouth 2 (two) times daily. 04/09/22   Cantwell, Celeste C, PA-C  atorvastatin (LIPITOR) 20 MG tablet Take 20 mg by mouth daily.    [provider]  cholecalciferol (VITAMIN D) 1000 UNITS tablet Take 1,000 Units by mouth daily. Vitamin d  3    [provider]  folic acid (FOLVITE) 800 MCG tablet Take 800 mcg by mouth.    [provider]  losartan (COZAAR) 100 MG tablet Take 100 mg by mouth every evening. Increase to 100 mg from 75 mg 06/12/22    [provider]  nebivolol (BYSTOLIC) 10 MG tablet Take 5 mg by mouth daily.    [provider]  triamterene-hydrochlorothiazide (MAXZIDE-25) 37.5-25 MG tablet Take 1 tablet by mouth daily. 03/09/23   Yates Decamp, MD  vitamin B-12 (CYANOCOBALAMIN) 1000 MCG tablet Take 1,000 mcg by mouth daily.    [provider]      Allergies    Patient has no known allergies.    Review of Systems   Review of Systems  Musculoskeletal:        Neck pain  All other systems reviewed and are negative.   Physical Exam Updated Vital Signs BP 132/65   Pulse 62   Temp 97.9 F (36.6 C)   Resp 13   SpO2 97%  Physical Exam Vitals and nursing note reviewed.   Gen: NAD Eyes: PERRL, EOMI HEENT: no oropharyngeal swelling Neck: trachea midline Resp: clear to auscultation bilaterally Card: RRR, no murmurs, rubs, or gallops Abd: nontender, nondistended Extremities: no calf tenderness, no edema MSK: The patient has no midline tenderness in the cervical spine, he does have some left-sided paraspinal tenderness as well as tenderness over the left trapezius Vascular: 2+ radial pulses bilaterally, 2+ DP pulses bilaterally Neuro: Equal strength and sensation throughout bilateral upper EXTR and lower extremities Skin: no rashes Psyc: acting appropriately   ED Results / Procedures / Treatments   Labs (all labs ordered are listed, but only abnormal results  are displayed) Labs Reviewed - No data to display  EKG EKG Interpretation Date/Time:  Wednesday June 10 2023 10:58:47 EDT Ventricular Rate:  68 PR Interval:  182 QRS Duration:  86 QT Interval:  379 QTC Calculation: 403 R Axis:   60  Text Interpretation: Sinus rhythm Confirmed by Beckey Downing [phone]) on 06/10/2023 11:26:33 AM  Radiology CT Cervical Spine Wo Contrast  Result Date: 06/10/2023 CLINICAL DATA:  Bilateral neck pain worse on the left. EXAM: CT CERVICAL SPINE WITHOUT CONTRAST TECHNIQUE: Multidetector CT imaging of the cervical spine was performed without intravenous contrast. Multiplanar CT image  reconstructions were also generated. RADIATION DOSE REDUCTION: This exam was performed according to the departmental dose-optimization program which includes automated exposure control, adjustment of the mA and/or kV according to patient size and/or use of iterative reconstruction technique. COMPARISON:  None Available. FINDINGS: Alignment: Normal. Skull base and vertebrae: Skull base alignment is maintained. Vertebral body heights are preserved. There is no evidence of acute fracture. There is no suspicious osseous lesion. Soft tissues and spinal canal: No prevertebral fluid or swelling. No visible canal hematoma. Disc levels: C2-C3: No significant spinal canal or neural foraminal stenosis C3-C4: Mild left worse than right uncovertebral and facet arthropathy without significant spinal canal or neural foraminal stenosis. C4-C5: There is left worse than right uncovertebral ridging and mild bilateral facet arthropathy resulting in moderate to severe left and no significant right neural foraminal stenosis and no significant spinal canal stenosis. C5-C6: There is mild uncovertebral and facet arthropathy resulting in mild to moderate left and mild right neural foraminal stenosis without significant spinal canal stenosis C6-C7: There is mild uncovertebral arthropathy resulting in moderate left and mild right neural foraminal stenosis without significant spinal canal stenosis C7-T1: No significant spinal canal or neural foraminal stenosis. Upper chest: The imaged lung apices are clear. Other: None. IMPRESSION: 1. No acute finding in the cervical spine. 2. 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 No significant spinal canal stenosis at any level. Electronically Signed   By: Lesia Hausen M.D.   On: 06/10/2023 11:44    Procedures Procedures    Medications Ordered in ED Medications - No data to display  ED Course/ Medical  Decision Making/ A&P                                 Medical Decision Making 77 year old male with past medical history of atrial fibrillation on Eliquis and hypertension presenting to the emergency department today with neck pain.  I will further evaluate him here with a CT scan to evaluate for bony abnormality such as compression fractures or lytic/blastic lesions given his age and new onset neck pain.  He has no red flag symptoms for cauda equina syndrome or cord compressing lesion at this time.  He has reassuring neurovascular exam.  I will reevaluate for ultimate disposition.  The patient's CT scan does show some degenerative changes and left foraminal stenosis which is consistent with his symptoms.  He does have neurosurgery he can follow-up with.  He is encouraged to follow-up.  He will be treated with topical anti-inflammatories and lidocaine patches as he is on Eliquis.  Amount and/or Complexity of Data Reviewed Radiology: ordered.           Final Clinical Impression(s) / ED Diagnoses Final diagnoses:  Cervical radiculopathy    Rx / DC  Orders ED Discharge Orders     None         Durwin Glaze, MD 06/10/23 1213

## 2023-06-10 NOTE — ED Notes (Signed)
Pt return from ct scan

## 2023-06-10 NOTE — ED Notes (Signed)
Pt currently cone for ct scan

## 2023-06-10 NOTE — Discharge Instructions (Addendum)
Your CT scan does show some degenerative changes and it looks like it is pushing on a nerve on the left.  You may pick up some lidocaine patches from the pharmacy over-the-counter and try these on your neck as well as Voltaren gel which is also over-the-counter.  If the pain gets bad is okay to take the hydrocodone that you have at home.  Please call to schedule an appointment with the neurosurgery clinic for reevaluation.  Return to the ER for weakness or worsening symptoms.

## 2023-06-16 DIAGNOSIS — M5412 Radiculopathy, cervical region: Secondary | ICD-10-CM | POA: Diagnosis not present

## 2023-06-18 ENCOUNTER — Ambulatory Visit: Payer: Medicare Other | Admitting: Cardiology

## 2023-07-08 ENCOUNTER — Encounter: Payer: Self-pay | Admitting: Cardiology

## 2023-07-08 ENCOUNTER — Ambulatory Visit: Payer: Medicare Other | Attending: Cardiology | Admitting: Cardiology

## 2023-07-08 VITALS — BP 126/68 | HR 65 | Resp 16 | Ht 70.0 in | Wt 229.4 lb

## 2023-07-08 DIAGNOSIS — I480 Paroxysmal atrial fibrillation: Secondary | ICD-10-CM

## 2023-07-08 DIAGNOSIS — I10 Essential (primary) hypertension: Secondary | ICD-10-CM | POA: Diagnosis not present

## 2023-07-08 DIAGNOSIS — G4733 Obstructive sleep apnea (adult) (pediatric): Secondary | ICD-10-CM | POA: Diagnosis not present

## 2023-07-08 DIAGNOSIS — E7800 Pure hypercholesterolemia, unspecified: Secondary | ICD-10-CM

## 2023-07-08 NOTE — Progress Notes (Signed)
Cardiology Office Note:  .   Date:  07/08/2023  ID:  Gregory Reid., DOB 01-15-1946, MRN [id number] PCP: Fatima Sanger, FNP  [facility] Providers Cardiologist:  Yates Decamp, MD    History of Present Illness: .   Gregory Reid. is a 77 y.o.   Discussed the use of AI scribe software for clinical note transcription with the patient, who gave verbal consent to proceed.  History of Present Illness   The patient, with a history of hypertension, hyperlipidemia, sleep apnea, and atrial fibrillation, presents for a routine follow-up. He reports that his blood pressure varies, sometimes as low as 112 and other times as high as 140. He denies any symptoms of heart racing. He has been monitoring his blood pressure at home and has noticed occasional irregularities, but these have been less frequent in the past six months. He has been adhering to his CPAP therapy for sleep apnea, despite not liking it. He also reports a high salt intake, which he is trying to reduce. He is active, walking a lot and participating in hunting and fishing.      Review of Systems  Cardiovascular:  Negative for chest pain, dyspnea on exertion and leg swelling.   Risk Assessment/Calculations:    CHA2DS2-VASc Score = 4   This indicates a 4.8% annual risk of stroke. The patient's score is based upon:  Lab Results  Component Value Date   NA 139 01/07/2023   K 4.4 01/07/2023   CO2 24 01/07/2023   GLUCOSE 93 01/07/2023   BUN 25 01/07/2023   CREATININE 1.36 (H) 01/07/2023   CALCIUM 9.3 01/07/2023   EGFR 54 (L) 01/07/2023   GFRNONAA >60 12/15/2018   Lab Results  Component Value Date   WBC 5.3 12/15/2018   HGB 14.3 03/10/2022   HCT 42.0 03/10/2022   MCV 100.2 (H) 12/15/2018   PLT 181 12/15/2018   External Labs:  Cholesterol, total 169.000 m 03/28/2021 HDL 41.000 mg 03/28/2021 LDL 96.000 mg 09/25/2020 Triglycerides 150.000 m 09/23/2022  Labs 09/24/2022:  Total cholesterol 150, triglycerides  150, HDL 41, LDL 79.  Sodium 139, potassium 4.4, BUN 18, creatinine 1.12, EGFR 63 mL.  LFTs normal.  Hb 13.6/HCT 41.0, platelets 212, normal indices.   TSH 1.130 09/23/2022  Physical Exam:   VS:  BP 126/68 (BP Location: Left Arm, Patient Position: Sitting, Cuff Size: Large)   Pulse 65   Resp 16   Ht 5\' 10"  (1.778 m)   Wt 229 lb 6.4 oz (104.1 kg)   SpO2 98%   BMI 32.92 kg/m    Wt Readings from Last 3 Encounters:  07/08/23 229 lb 6.4 oz (104.1 kg)  12/17/22 237 lb (107.5 kg)  10/15/22 230 lb (104.3 kg)     Physical Exam Neck:     Vascular: No carotid bruit or JVD.  Cardiovascular:     Rate and Rhythm: Normal rate and regular rhythm.     Pulses: Intact distal pulses.     Heart sounds: Normal heart sounds. No murmur heard.    No gallop.  Pulmonary:     Effort: Pulmonary effort is normal.     Breath sounds: Normal breath sounds.  Abdominal:     General: Bowel sounds are normal.     Palpations: Abdomen is soft.  Musculoskeletal:     Right lower leg: No edema.     Left lower leg: No edema.    Studies Reviewed: Marland Kitchen    EKG:  EKG Interpretation Date/Time:  Wednesday July 08 2023 11:09:19 EDT Ventricular Rate:  63 PR Interval:  142 QRS Duration:  80 QT Interval:  384 QTC Calculation: 392 R Axis:   30  Text Interpretation: EKG 07/08/2023: Normal sinus rhythm at the rate of 63 bpm.  No significant change from 06/10/2023. Confirmed by Delrae Rend [id number]) on 07/08/2023 11:27:22 AM    EKG 12/17/2022: Normal sinus rhythm with rate of 61 bpm, normal axis. Single PAC. No evidence of ischemia, normal EKG.   Exercise nuclear stress test 05/11/2022: Normal perfusion, mild diaphragmatic continuation, normal LVEF at 58%.  Exercise duration 6 minutes and 38 seconds and 8.1 METS achieved.  Echocardiogram 05/14/2022: Normal LV systolic function with visual EF 65-70%. Left ventricle cavity is normal in size. Normal left ventricular wall thickness. Normal global wall motion.  Doppler evidence of grade I (impaired) diastolic dysfunction, normal LAP. Calculated EF 73%. Structurally normal tricuspid valve.  Mild tricuspid regurgitation. No evidence of pulmonary hypertension. RVSP measures 33 mmHg. no prior available for comparison   Zio Patch Extended out patient EKG monitoring 11 days starting 05/19/2022: Predominant rhythm is normal sinus rhythm.  Minimum heart rate 41, maximum heart rate 103 bpm.  There were 22 atrial tachycardia episodes longest lasting 12 seconds at the rate of 123 bpm.  There are other isolated PACs and atrial couplets and rare triplets. There are occasional PVCs, 2.2% burden and occasional ventricular couplets.  Occasional ventricular bigeminy and trigeminy were present. There were no patient activated events. There is no atrial fibrillation, there was no heart block.  Carotid artery duplex 05/14/2022:  Minimal bilateral ICA plaque, <50% stenosis bilateral external carotid artery, antegrade vertebral artery flow.  ASSESSMENT AND PLAN: .      ICD-10-CM   1. Paroxysmal atrial fibrillation (HCC)  I48.0 EKG 12-Lead    2. Primary hypertension  I10     3. OSA on CPAP Severe OSA 08/04/2022  G47.33     4. Pure hypercholesterolemia  E78.00      CHA2DS2-VASc Score = 4 [CHF History: 0, HTN History: 1, Diabetes History: 1, Stroke History: 0, Vascular Disease History: 0, Age Score: 2, Gender Score: 0].  Therefore, the patient's annual risk of stroke is 4.8 %.      Assessment and Plan    Atrial Fibrillation Well controlled on Apixaban and Bystolic. No recent episodes of heart racing. -Continue Apixaban and Bystolic.  Hypertension Blood pressure well controlled with occasional variation. On Amlodipine, Losartan, and Maxzide. -Continue Amlodipine, Losartan, and Maxzide.  Hyperlipidemia Cholesterol well controlled on Atorvastatin 20mg  daily. -Continue Atorvastatin 20mg  daily.  Sleep Apnea Using CPAP regularly despite discomfort. -Continue  CPAP use.  General Health Maintenance Regular physical activity with walking and fishing. Noted high salt intake but has made efforts to reduce. -Encourage continued physical activity and low salt diet.  Follow-up in 1 year.      Signed,  Yates Decamp, MD, [facility] 07/08/2023, 7:08 PM [facility] [address] [place], [place] [address] Phone: [phone]. Fax:  [phone]

## 2023-07-08 NOTE — Patient Instructions (Addendum)
Medication Instructions:  Your physician recommends that you continue on your current medications as directed. Please refer to the Current Medication list given to you today.  *If you need a refill on your cardiac medications before your next appointment, please call your pharmacy*  Lab Work: None ordered today.  Testing/Procedures: None ordered today.  Follow-Up: At [facility], you and your health needs are our priority.  As part of our continuing mission to provide you with exceptional heart care, we have created designated Provider Care Teams.  These Care Teams include your primary Cardiologist (physician) and Advanced Practice Providers (APPs -  Physician Assistants and Nurse Practitioners) who all work together to provide you with the care you need, when you need it.  Your next appointment:   1 year(s)  The format for your next appointment:   In Person  Provider:   Yates Decamp, MD

## 2023-07-13 ENCOUNTER — Telehealth: Payer: Self-pay

## 2023-07-13 NOTE — Telephone Encounter (Signed)
Transition Care Management Follow-up Telephone Call Date of discharge and from where: 06/10/2023 [facility] How have you been since you were released from the hospital? Patient stated he is feeling much better, no further issues with his neck. Any questions or concerns? No  Items Reviewed: Did the pt receive and understand the discharge instructions provided? Yes  Medications obtained and verified? Yes  Other? No  Any new allergies since your discharge? No  Dietary orders reviewed? Yes Do you have support at home? Yes   Follow up appointments reviewed:  PCP Hospital f/u appt confirmed? No  Scheduled to see  on  @ . Specialist Hospital f/u appt confirmed? Yes  Scheduled to see Verlin Dike, NP on 06/16/2023 @ [facility]. Are transportation arrangements needed? No  If their condition worsens, is the pt aware to call PCP or go to the Emergency Dept.? Yes Was the patient provided with contact information for the PCP's office or ED? Yes Was to pt encouraged to call back with questions or concerns? Yes   Xzaria Teo [facility]  [facility] Guide Direct Dial: [phone]  Website: [email or web address]

## 2023-07-28 DIAGNOSIS — H52203 Unspecified astigmatism, bilateral: Secondary | ICD-10-CM | POA: Diagnosis not present

## 2023-07-28 DIAGNOSIS — H2513 Age-related nuclear cataract, bilateral: Secondary | ICD-10-CM | POA: Diagnosis not present

## 2023-08-10 ENCOUNTER — Ambulatory Visit
Admission: EM | Admit: 2023-08-10 | Discharge: 2023-08-10 | Disposition: A | Discharge status: Home / Self Care | Payer: Medicare Other | Attending: Family Medicine | Admitting: Family Medicine

## 2023-08-10 ENCOUNTER — Ambulatory Visit: Payer: Medicare Other

## 2023-08-10 ENCOUNTER — Telehealth: Payer: Self-pay | Admitting: Family Medicine

## 2023-08-10 ENCOUNTER — Encounter: Payer: Self-pay | Admitting: Emergency Medicine

## 2023-08-10 DIAGNOSIS — I4891 Unspecified atrial fibrillation: Secondary | ICD-10-CM | POA: Insufficient documentation

## 2023-08-10 DIAGNOSIS — R6889 Other general symptoms and signs: Secondary | ICD-10-CM | POA: Insufficient documentation

## 2023-08-10 DIAGNOSIS — E7800 Pure hypercholesterolemia, unspecified: Secondary | ICD-10-CM | POA: Insufficient documentation

## 2023-08-10 DIAGNOSIS — H9190 Unspecified hearing loss, unspecified ear: Secondary | ICD-10-CM | POA: Insufficient documentation

## 2023-08-10 DIAGNOSIS — M5442 Lumbago with sciatica, left side: Secondary | ICD-10-CM | POA: Insufficient documentation

## 2023-08-10 DIAGNOSIS — M1611 Unilateral primary osteoarthritis, right hip: Secondary | ICD-10-CM | POA: Diagnosis not present

## 2023-08-10 DIAGNOSIS — E559 Vitamin D deficiency, unspecified: Secondary | ICD-10-CM | POA: Insufficient documentation

## 2023-08-10 DIAGNOSIS — J45909 Unspecified asthma, uncomplicated: Secondary | ICD-10-CM | POA: Insufficient documentation

## 2023-08-10 DIAGNOSIS — M25551 Pain in right hip: Secondary | ICD-10-CM | POA: Diagnosis not present

## 2023-08-10 DIAGNOSIS — M79604 Pain in right leg: Secondary | ICD-10-CM

## 2023-08-10 DIAGNOSIS — M79661 Pain in right lower leg: Secondary | ICD-10-CM | POA: Diagnosis not present

## 2023-08-10 DIAGNOSIS — R059 Cough, unspecified: Secondary | ICD-10-CM | POA: Insufficient documentation

## 2023-08-10 DIAGNOSIS — G4733 Obstructive sleep apnea (adult) (pediatric): Secondary | ICD-10-CM | POA: Insufficient documentation

## 2023-08-10 DIAGNOSIS — J40 Bronchitis, not specified as acute or chronic: Secondary | ICD-10-CM | POA: Insufficient documentation

## 2023-08-10 DIAGNOSIS — I480 Paroxysmal atrial fibrillation: Secondary | ICD-10-CM | POA: Insufficient documentation

## 2023-08-10 DIAGNOSIS — M5432 Sciatica, left side: Secondary | ICD-10-CM | POA: Insufficient documentation

## 2023-08-10 DIAGNOSIS — Z7901 Long term (current) use of anticoagulants: Secondary | ICD-10-CM | POA: Insufficient documentation

## 2023-08-10 DIAGNOSIS — M79673 Pain in unspecified foot: Secondary | ICD-10-CM | POA: Insufficient documentation

## 2023-08-10 DIAGNOSIS — L719 Rosacea, unspecified: Secondary | ICD-10-CM | POA: Insufficient documentation

## 2023-08-10 DIAGNOSIS — R972 Elevated prostate specific antigen [PSA]: Secondary | ICD-10-CM | POA: Insufficient documentation

## 2023-08-10 DIAGNOSIS — Z0000 Encounter for general adult medical examination without abnormal findings: Secondary | ICD-10-CM | POA: Insufficient documentation

## 2023-08-10 DIAGNOSIS — M5412 Radiculopathy, cervical region: Secondary | ICD-10-CM | POA: Insufficient documentation

## 2023-08-10 DIAGNOSIS — R200 Anesthesia of skin: Secondary | ICD-10-CM | POA: Insufficient documentation
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
## 2023-08-10 NOTE — Telephone Encounter (Signed)
Called patient,  LMOR of normal xray.  He may come out of the boot at any time.  If he continues with pain to follow up with his primary care provider

## 2023-08-17 DIAGNOSIS — S8011XD Contusion of right lower leg, subsequent encounter: Secondary | ICD-10-CM | POA: Diagnosis not present

## 2023-09-28 DIAGNOSIS — I10 Essential (primary) hypertension: Secondary | ICD-10-CM | POA: Diagnosis not present

## 2023-09-28 DIAGNOSIS — E559 Vitamin D deficiency, unspecified: Secondary | ICD-10-CM | POA: Diagnosis not present

## 2023-09-28 DIAGNOSIS — R5383 Other fatigue: Secondary | ICD-10-CM | POA: Diagnosis not present

## 2023-09-28 DIAGNOSIS — E7800 Pure hypercholesterolemia, unspecified: Secondary | ICD-10-CM | POA: Diagnosis not present

## 2023-09-29 LAB — LAB REPORT - SCANNED: EGFR: 53

## 2023-10-05 DIAGNOSIS — Z7901 Long term (current) use of anticoagulants: Secondary | ICD-10-CM | POA: Diagnosis not present

## 2023-10-05 DIAGNOSIS — E559 Vitamin D deficiency, unspecified: Secondary | ICD-10-CM | POA: Diagnosis not present

## 2023-10-05 DIAGNOSIS — Z0000 Encounter for general adult medical examination without abnormal findings: Secondary | ICD-10-CM | POA: Diagnosis not present

## 2023-10-05 DIAGNOSIS — E7800 Pure hypercholesterolemia, unspecified: Secondary | ICD-10-CM | POA: Diagnosis not present

## 2023-10-05 DIAGNOSIS — I10 Essential (primary) hypertension: Secondary | ICD-10-CM | POA: Diagnosis not present

## 2023-10-05 DIAGNOSIS — I480 Paroxysmal atrial fibrillation: Secondary | ICD-10-CM | POA: Diagnosis not present

## 2023-10-15 ENCOUNTER — Ambulatory Visit: Payer: Medicare Other | Admitting: Neurology

## 2023-10-15 ENCOUNTER — Encounter: Payer: Self-pay | Admitting: Neurology

## 2023-10-15 VITALS — BP 128/65 | HR 79 | Ht 70.0 in | Wt 232.0 lb

## 2023-10-15 DIAGNOSIS — G4733 Obstructive sleep apnea (adult) (pediatric): Secondary | ICD-10-CM

## 2023-10-15 NOTE — Progress Notes (Signed)
Subjective:    Patient ID: Gregory Reid. is a 78 y.o. male.  HPI    Interim history:   Gregory Reid is a 78 year old right-handed gentleman with an underlying medical history of paroxysmal A-fib, hypertension, hyperlipidemia, gout, history of kidney stones, prostate cancer and mild obesity, who presents for follow-up consultation of his obstructive sleep apnea, on CPAP therapy.  The patient is unaccompanied today and presents for his 1 year checkup.  I last saw him on 10/15/2022, at which time he was compliant with treatment.  He was still adjusting to it.  He had noticed some improvement in his nocturia and his sleep quality.  He was advised to follow-up routinely in 1 year.  Today, 10/15/2023: I reviewed his CPAP compliance data from 09/14/2023 through 10/13/2023, which is a total of 30 days, during which time he used his machine every night with percent use days greater than 4 hours at 97%, indicating excellent compliance with an average usage of 8 hours and 9 minutes, residual AHI at goal at 1.7/h, leak acceptable with the 95th percentile at 9.3 L/min on a pressure of 10 cm with EPR of 3.  He reports doing well, using nasal pillows with good tolerance and ongoing good results, no new issues, but has not had to order any recent supplies and had no contact with DME in some months. Will travel to [facility] for about 2-3 months soon.   The patient's allergies, current medications, family history, past medical history, past social history, past surgical history and problem list were reviewed and updated as appropriate.    Previously:   I first met him at the request of his cardiology PA on 05/29/2022, at which time he reported snoring and sleep disruption, particularly from nocturia.  He had been diagnosed with atrial fibrillation.  He was advised to proceed with a sleep study.  He had a laboratory attended sleep study which was conducted as an emergency split study due to severe sleep disordered  breathing.  His split-night sleep study from 07/29/2022 showed a baseline AHI of 39.2/h, O2 nadir 81%.  During the titration portion of the study he did well with CPAP of 10 cm and I recommended home CPAP therapy.  His set up date was 12/24/2021.  He has a ResMed air sense 10 AutoSet machine.  His DME company is [employer].   I reviewed his CPAP compliance data from 09/14/2022 through 10/13/2022, which is a total of 30 days, during which time he used his machine every night with percent use days greater than 4 hours at 97%, indicating excellent compliance with an average usage of 7 hours and 47 minutes, residual AHI at goal at 2/h, leak acceptable with the 95th percentile at 10.7 L/min on a pressure of 10 cm with EPR of 3.    05/29/22: (He) reports snoring and some sleep disruption from nocturia.  He reports that his snoring can be disturbing to his wife.  I reviewed your office note from 04/09/2022.  His Epworth sleepiness score is 1 out of 24, fatigue severity score is 21 out of 63.  He was recently found to be in atrial fibrillation, he was started immediately on Eliquis by PCP, he is wearing a Zio patch currently until next week.  He has a follow-up appointment with Dr. Jacinto Halim in about 2 weeks.  He has nocturia about twice per average night, sometimes only once, sometimes as many as 4 times.  He had prostate cancer treatment with seed implants  some 4 years ago.  He has no family history of sleep apnea.  Bedtime varies, generally in bed somewhere between 8 PM and midnight.  Rise time depends on what he has going on for the day, he likes to go fishing and goes deer hunting and gets up.  Average rise time is around 6:30 AM.  He denies recurrent morning headaches.  He drinks caffeine in the form of coffee and soda, 1 serving each on average.  He drinks very rarely.  He is a non-smoker.  He is retired and lives with his wife, they have no children, no pets in the household.  Weight has been more or less stable.  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  His blood pressure has been fluctuating.  He is working on weight loss.       His Past Medical History Is Significant For: 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)    Hypertension    Nephrolithiasis    OSA on CPAP    Prostate cancer [facility]) urologist-  dr dahlstedt/  oncologist- dr Kathrynn Running   dx 05/ 2019--- Stage T1c,  Gleason 3+4   Wears glasses     His Past Surgical History Is Significant For: Past Surgical History:  Procedure Laterality Date   colonscopy  10/14/2018   CYSTOSCOPY N/A 12/22/2018   Procedure: CYSTOSCOPY;  Surgeon: Marcine Matar, MD;  Location: [facility];  Service: Urology;  Laterality: N/A;   CYSTOSCOPY WITH RETROGRADE PYELOGRAM, URETEROSCOPY AND STENT PLACEMENT Left 03/10/2022   Procedure: CYSTOSCOPY WITH RETROGRADE PYELOGRAM, URETEROSCOPY, STONE BASKETTING AND STENT PLACEMENT;  Surgeon: Marcine Matar, MD;  Location: [facility];  Service: Urology;  Laterality: Left;   EXTRACORPOREAL SHOCK WAVE LITHOTRIPSY Left 07/27/2017   Procedure: LEFT EXTRACORPOREAL SHOCK WAVE LITHOTRIPSY (ESWL);  Surgeon: Jerilee Field, MD;  Location: [facility];  Service: Urology;  Laterality: Left;   EXTRACORPOREAL SHOCK WAVE LITHOTRIPSY Right 07/23/2020   Procedure: EXTRACORPOREAL SHOCK WAVE LITHOTRIPSY (ESWL);  Surgeon: Bjorn Pippin, MD;  Location: [facility];  Service: Urology;  Laterality: Right;   FOOT SURGERY  1960s   removal foreign body,  per unsure which foot   LUMBAR LAMINECTOMY/DECOMPRESSION MICRODISCECTOMY Left 02/11/2017   Procedure: LEFT LUMBAR FOUR- LUMBAR FIVE LAMINOTOMY,FORAMINOTOMY,  MICRODISCECTOMY;  Surgeon: Shirlean Kelly, MD;  Location: [facility];  Service: Neurosurgery;  Laterality: Left;   RADIOACTIVE SEED IMPLANT  N/A 12/22/2018   Procedure: RADIOACTIVE SEED IMPLANT/BRACHYTHERAPY IMPLANT;  Surgeon: Marcine Matar, MD;  Location: [facility];  Service: Urology;  Laterality: N/A;   SHOULDER ARTHROSCOPY Left 2009   SPACE OAR INSTILLATION N/A 12/22/2018   Procedure: SPACE OAR INSTILLATION;  Surgeon: Marcine Matar, MD;  Location: [facility];  Service: Urology;  Laterality: N/A;    His Family History Is Significant For: Family History  Problem Relation Age of Onset   Heart disease Mother    Lung cancer Father 51       smoked approximately 40-45 years then stopped   Breast cancer Sister    Stroke Sister    Ovarian cancer Sister    Melanoma Brother    Prostate cancer Maternal Grandfather    Pancreatic cancer Neg Hx    Colon cancer Neg Hx     His Social History Is Significant  For: Social History   Socioeconomic History   Marital status: Married    Spouse name: Printmaker   Number of children: 0   Years of education: Not on file   Highest education level: Not on file  Occupational History    Comment: retired  Tobacco Use   Smoking status: Never   Smokeless tobacco: Never  Vaping Use   Vaping status: Never Used  Substance and Sexual Activity   Alcohol use: Yes    Comment: rarely   Drug use: Never   Sexual activity: Not on file  Other Topics Concern   Not on file  Social History Narrative   Caffeine: 1/3 cup per day on average   Right handed   Social Drivers of Corporate investment banker Strain: Not on file  Food Insecurity: Not on file  Transportation Needs: Not on file  Physical Activity: Not on file  Stress: Not on file  Social Connections: Not on file    His Allergies Are:  No Known Allergies:   His Current Medications Are:  Outpatient Encounter Medications as of 10/15/2023  Medication Sig   allopurinol (ZYLOPRIM) 300 MG tablet Take 300 mg by mouth daily.   amLODipine (NORVASC) 5 MG tablet Take 5 mg by mouth daily.   apixaban (ELIQUIS) 5 MG TABS tablet Take 1  tablet (5 mg total) by mouth 2 (two) times daily.   atorvastatin (LIPITOR) 20 MG tablet Take 20 mg by mouth daily.   cholecalciferol (VITAMIN D) 1000 UNITS tablet Take 2,000 Units by mouth daily. Vitamin d  3   folic acid (FOLVITE) 800 MCG tablet Take 800 mcg by mouth.   losartan (COZAAR) 100 MG tablet Take 100 mg by mouth every evening. Increase to 100 mg from 75 mg 06/12/22   nebivolol (BYSTOLIC) 10 MG tablet Take 5 mg by mouth daily.   triamterene-hydrochlorothiazide (MAXZIDE-25) 37.5-25 MG tablet Take 1 tablet by mouth daily.   vitamin B-12 (CYANOCOBALAMIN) 1000 MCG tablet Take 1,000 mcg by mouth daily.   No facility-administered encounter medications on file as of 10/15/2023.  :  Review of Systems:  Out of a complete 14 point review of systems, all are reviewed and negative with the exception of these symptoms as listed below:  Review of Systems  Neurological:        Patient is here alone for yearly cpap follow-up. He states he is not having any trouble with his machine. He does want to ask about his DME. He states he hasn't gotten a call in months to check on supply renewals, etc. He doesn't believe he needs any but he found it odd that they stopped calling him and doing automatic refills on supplies. He stopped putting water in his machine and denies any issues with it. Patient will soon be traveling to [place] for the rest of the winter. ESS 0    Objective:  Neurological Exam  Physical Exam Physical Examination:   Vitals:   10/15/23 1100  BP: 128/65  Pulse: 79    General Examination: The patient is a very pleasant 78 y.o. male in no acute distress. He appears well-developed and well-nourished and well groomed.   HEENT: Normocephalic, atraumatic, pupils are equal, round and reactive to light, extraocular tracking is good without limitation to gaze excursion or nystagmus noted. Hearing is grossly intact. Face is symmetric with normal facial animation. Speech is clear with no  dysarthria noted. There is no hypophonia. There is no lip, neck/head, jaw or  voice tremor. Neck is supple with full range of passive and active motion. There are no carotid bruits on auscultation. Oropharynx exam reveals: mild mouth dryness, adequate dental hygiene and moderate airway crowding. Tongue protrudes centrally and palate elevates symmetrically.       Chest: Clear to auscultation without wheezing, rhonchi or crackles noted.    Heart: S1+S2+0, regular and normal without murmurs, rubs or gallops noted.    Abdomen: Soft, non-tender and non-distended.   Extremities: There is no edema in the distal lower extremities bilaterally.    Skin: Warm and dry without trophic changes noted.    Musculoskeletal: exam reveals no obvious joint deformities.    Neurologically:  Mental status: The patient is awake, alert and oriented in all 4 spheres. His immediate and remote memory, attention, language skills and fund of knowledge are appropriate. There is no evidence of aphasia, agnosia, apraxia or anomia. Speech is clear with normal prosody and enunciation. Thought process is linear. Mood is normal and affect is normal.  Cranial nerves II - XII are as described above under HEENT exam.  Motor exam: Normal bulk, strength and tone is noted. There is no obvious tremor. Fine motor skills and coordination: grossly intact.  Cerebellar testing: No dysmetria or intention tremor. There is no truncal or gait ataxia.  Sensory exam: intact to light touch in the upper and lower extremities.  Gait, station and balance: He stands easily. No veering to one side is noted. No leaning to one side is noted. Posture is age-appropriate and stance is narrow based. Gait shows normal stride length and normal pace. No problems turning are noted.    Assessment and Plan:  In summary, Alexs Bifano. is a very pleasant 78 year old right-handed gentleman with an underlying medical history of paroxysmal A-fib, hypertension,  hyperlipidemia, gout, history of kidney stones, prostate cancer and mild obesity, who presents for follow-up consultation of his obstructive sleep apnea, on CPAP therapy, for his 1 year checkup.  He has adjusted well to treatment and is compliant with his CPAP machine, he is up-to-date with his supplies, he does not actually need any new supplies quite yet but I would be happy to place an order if he needs to order more supplies in the near future.  He is commended for his treatment adherence.  He is advised to follow-up routinely in this clinic to see one of our nurse practitioners in 1 year.  I answered all his questions today and he was in agreement.   I spent 20 minutes in total face-to-face time and in reviewing records during pre-charting, more than 50% of which was spent in counseling and coordination of care, reviewing test results, reviewing medications and treatment regimen and/or in discussing or reviewing the diagnosis of OSA, the prognosis and treatment options. Pertinent laboratory and imaging test results that were available during this visit with the patient were reviewed by me and considered in my medical decision making (see chart for details).

## 2023-10-15 NOTE — Patient Instructions (Signed)

## 2023-10-16 DIAGNOSIS — L814 Other melanin hyperpigmentation: Secondary | ICD-10-CM | POA: Diagnosis not present

## 2023-10-16 DIAGNOSIS — D485 Neoplasm of uncertain behavior of skin: Secondary | ICD-10-CM | POA: Diagnosis not present

## 2023-10-16 DIAGNOSIS — D2372 Other benign neoplasm of skin of left lower limb, including hip: Secondary | ICD-10-CM | POA: Diagnosis not present

## 2023-10-16 DIAGNOSIS — L570 Actinic keratosis: Secondary | ICD-10-CM | POA: Diagnosis not present

## 2023-10-16 DIAGNOSIS — D225 Melanocytic nevi of trunk: Secondary | ICD-10-CM | POA: Diagnosis not present

## 2023-10-16 DIAGNOSIS — L821 Other seborrheic keratosis: Secondary | ICD-10-CM | POA: Diagnosis not present

## 2024-01-07 DIAGNOSIS — Z860101 Personal history of adenomatous and serrated colon polyps: Secondary | ICD-10-CM | POA: Diagnosis not present

## 2024-02-03 ENCOUNTER — Telehealth: Payer: Self-pay | Admitting: Adult Health

## 2024-02-03 NOTE — Telephone Encounter (Signed)
 Gregory Reid from [facility] called stating she Faxed a form over for Provider to sign . Representative  want to confirm that we received fax and  to fax form back [facility].  Callback# [phone] Fax# [phone]

## 2024-02-04 NOTE — Telephone Encounter (Signed)
 I called and they will refax to us  the form needed for new cpap supplies.  [phone].

## 2024-02-24 ENCOUNTER — Other Ambulatory Visit: Payer: Self-pay | Admitting: Cardiology

## 2024-02-24 DIAGNOSIS — I10 Essential (primary) hypertension: Secondary | ICD-10-CM

## 2024-04-04 ENCOUNTER — Encounter: Payer: Self-pay | Admitting: Registered Nurse

## 2024-04-04 DIAGNOSIS — Z87442 Personal history of urinary calculi: Secondary | ICD-10-CM | POA: Diagnosis not present

## 2024-04-04 DIAGNOSIS — E559 Vitamin D deficiency, unspecified: Secondary | ICD-10-CM | POA: Diagnosis not present

## 2024-04-04 DIAGNOSIS — I10 Essential (primary) hypertension: Secondary | ICD-10-CM | POA: Diagnosis not present

## 2024-04-04 DIAGNOSIS — T148XXA Other injury of unspecified body region, initial encounter: Secondary | ICD-10-CM | POA: Diagnosis not present

## 2024-04-04 DIAGNOSIS — Z7901 Long term (current) use of anticoagulants: Secondary | ICD-10-CM | POA: Diagnosis not present

## 2024-04-04 DIAGNOSIS — E7800 Pure hypercholesterolemia, unspecified: Secondary | ICD-10-CM | POA: Diagnosis not present

## 2024-04-04 DIAGNOSIS — I480 Paroxysmal atrial fibrillation: Secondary | ICD-10-CM | POA: Diagnosis not present

## 2024-08-19 ENCOUNTER — Other Ambulatory Visit: Payer: Self-pay | Admitting: Cardiology

## 2024-08-19 DIAGNOSIS — I10 Essential (primary) hypertension: Secondary | ICD-10-CM

## 2024-09-14 ENCOUNTER — Other Ambulatory Visit: Payer: Self-pay | Admitting: Cardiology

## 2024-09-14 DIAGNOSIS — I10 Essential (primary) hypertension: Secondary | ICD-10-CM

## 2024-09-16 ENCOUNTER — Other Ambulatory Visit: Payer: Self-pay | Admitting: *Deleted

## 2024-09-16 ENCOUNTER — Telehealth: Payer: Self-pay | Admitting: Cardiology

## 2024-09-16 DIAGNOSIS — I10 Essential (primary) hypertension: Secondary | ICD-10-CM

## 2024-09-16 MED ORDER — TRIAMTERENE-HCTZ 37.5-25 MG PO TABS: 1.0000 | ORAL_TABLET | Freq: Every day | ORAL | 3 refills | Status: AC

## 2024-09-16 NOTE — Telephone Encounter (Signed)
" °*  STAT* If patient is at the pharmacy, call can be transferred to refill team.   1. Which medications need to be refilled? (please list name of each medication and dose if known)   triamterene -hydrochlorothiazide (MAXZIDE-25) 37.5-25 MG tablet   2. Would you like to learn more about the convenience, safety, & potential cost savings by using the [facility]?   3. Are you open to using the [facility] (Type [facility]. ).  4. Which pharmacy/location (including street and city if local pharmacy) is medication to be sent to?  [id number] - [place], [place] - [address]   5. Do they need a 30 day or 90 day supply?   Patient stated he has a couple of tablets left.  Patient has appointment with Dr. Ladona on 3/9.   "

## 2024-09-23 ENCOUNTER — Telehealth: Payer: Self-pay | Admitting: Cardiology

## 2024-09-23 NOTE — Telephone Encounter (Signed)
 Spoke with pt regarding his heart rate. Pt stated he has noticed his heart rate has been irregular and stated that he was told by Dr. Ladona to let him know if this happened. Pt stated he has been having some dizziness with position changes but that his blood pressure has been normal (120s/60s). Pt's bp this morning was 128/67 and his pulse was 74. Pt denied any shortness of breath or chest pain. Pt stated he feels a little weak when his pulse is irregular. Pt was told Dr. Ladona would be notified. Pt was given Ed precautions. Pt verbalized understanding. All questions if any were answered.

## 2024-09-23 NOTE — Telephone Encounter (Signed)
 Patient states he is not having any palpitations. Other than occasionally feeling weak/dizzy when HR is irregular he does not have any symptoms.  Patient states he wore a heart monitor in the past but that it did not show anything concerning. He states this irregular heart rate doesn't occur frequently and he may not have it again for another year.  Reviewed with patient again Dr. Godfrey recommendation: If palpitations are persisting beyond 12 hours to let us  know and he can come in for EKG or referred to A. Fib clinic to be seen     Patient states he will let us  know if symptoms persist.

## 2024-09-23 NOTE — Telephone Encounter (Signed)
 Patient c/o Palpitations: STAT if patient c/o lightheadedness, shortness of breath, or chest pain  How long have you had palpitations/irregular HR/ Afib? Are you having the symptoms now?  Monitor is showing irregular HR 3. Patient says Dr. Ladona advised to be seen right away if this ever happens.  Are you currently experiencing lightheadedness, SOB or CP?  No   Do you have a history of afib (atrial fibrillation) or irregular heart rhythm?  Hx afib  Have you checked your BP or HR? (document readings if available):  128/67 74  Are you experiencing any other symptoms?  Weakness, occasional dizziness

## 2024-09-23 NOTE — Telephone Encounter (Signed)
 If palpitations are persisting beyond 12 hours to let us  know and he can come in for EKG or referred to A. Fib clinic to be seen

## 2024-09-29 NOTE — Progress Notes (Unsigned)
 " [facility] [address] [place]. [place] [address] [phone]       OFFICE FOLLOW UP NOTE  Mr. Nalin Mazzocco. Date of Birth:  [birth year] Medical Record Number:  [id number]    Primary neurologist: Dr. Buck Reason for visit: CPAP follow-up    SUBJECTIVE:   CHIEF COMPLAINT:  No chief complaint on file.   Follow-up visit:  Prior visit: 10/15/2023 with Dr. Buck  Brief HPI:   Elsie MARLA Janeth Mickey. Is a 79 y.o. male who is followed for OSA on CPAP.  Split-night study 07/2022 showed baseline AHI 39.2/h with O2 nadir of 81% with titration study showing adequate management with CPAP 10 and proceeded with CPAP therapy, set up 08/2022.       Interval history:        ROS:   14 system review of systems performed and negative with exception of those listed in HPI  PMH:  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)    Hypertension    Nephrolithiasis    OSA on CPAP    Prostate cancer [facility]) urologist-  dr dahlstedt/  oncologist- dr patrcia   dx 05/ 2019--- Stage T1c,  Gleason 3+4   Wears glasses     PSH:  Past Surgical History:  Procedure Laterality Date   colonscopy  10/14/2018   CYSTOSCOPY N/A 12/22/2018   Procedure: CYSTOSCOPY;  Surgeon: Matilda Senior, MD;  Location: [facility];  Service: Urology;  Laterality: N/A;   CYSTOSCOPY WITH RETROGRADE PYELOGRAM, URETEROSCOPY AND STENT PLACEMENT Left 03/10/2022   Procedure: CYSTOSCOPY WITH RETROGRADE PYELOGRAM, URETEROSCOPY, STONE BASKETTING AND STENT PLACEMENT;  Surgeon: Matilda Senior, MD;  Location: [facility];  Service: Urology;  Laterality: Left;   EXTRACORPOREAL SHOCK WAVE LITHOTRIPSY Left 07/27/2017   Procedure: LEFT EXTRACORPOREAL SHOCK WAVE LITHOTRIPSY (ESWL);  Surgeon: Nieves Cough, MD;   Location: [facility];  Service: Urology;  Laterality: Left;   EXTRACORPOREAL SHOCK WAVE LITHOTRIPSY Right 07/23/2020   Procedure: EXTRACORPOREAL SHOCK WAVE LITHOTRIPSY (ESWL);  Surgeon: Watt Rush, MD;  Location: [facility];  Service: Urology;  Laterality: Right;   FOOT SURGERY  1960s   removal foreign body,  per unsure which foot   LUMBAR LAMINECTOMY/DECOMPRESSION MICRODISCECTOMY Left 02/11/2017   Procedure: LEFT LUMBAR FOUR- LUMBAR FIVE LAMINOTOMY,FORAMINOTOMY,  MICRODISCECTOMY;  Surgeon: Alix Charleston, MD;  Location: [facility];  Service: Neurosurgery;  Laterality: Left;   RADIOACTIVE SEED IMPLANT N/A 12/22/2018   Procedure: RADIOACTIVE SEED IMPLANT/BRACHYTHERAPY IMPLANT;  Surgeon: Matilda Senior, MD;  Location: [facility];  Service: Urology;  Laterality: N/A;   SHOULDER ARTHROSCOPY Left 2009   SPACE OAR INSTILLATION N/A 12/22/2018   Procedure: SPACE OAR INSTILLATION;  Surgeon: Matilda Senior, MD;  Location: [facility];  Service: Urology;  Laterality: N/A;    Social History:  Social History   Socioeconomic History   Marital status: Married    Spouse name: printmaker   Number of children: 0   Years of education: Not on file   Highest education level: Not on file  Occupational History    Comment: retired  Tobacco Use   Smoking status: Never   Smokeless tobacco: Never  Vaping Use   Vaping status: Never Used  Substance and Sexual Activity   Alcohol use: Yes    Comment:  rarely   Drug use: Never   Sexual activity: Not on file  Other Topics Concern   Not on file  Social History Narrative   Caffeine: 1/3 cup per day on average   Right handed   Social Drivers of Health   Tobacco Use: Low Risk (10/15/2023)   Patient History    Smoking Tobacco Use: Never    Smokeless Tobacco Use: Never    Passive Exposure: Not on file  Financial Resource Strain: Not on file  Food Insecurity: Not on file  Transportation Needs: Not on file  Physical Activity: Not on file   Stress: Not on file  Social Connections: Not on file  Intimate Partner Violence: Not on file  Depression (EYV7-0): Not on file  Alcohol Screen: Not on file  Housing: Not on file  Utilities: Not on file  Health Literacy: Not on file    Family History:  Family History  Problem Relation Age of Onset   Heart disease Mother    Lung cancer Father 67       smoked approximately 40-45 years then stopped   Breast cancer Sister    Stroke Sister    Ovarian cancer Sister    Melanoma Brother    Prostate cancer Maternal Grandfather    Pancreatic cancer Neg Hx    Colon cancer Neg Hx     Medications:  Medications Ordered Prior to Encounter[1]  Allergies:  Allergies[2]    OBJECTIVE:  Physical Exam  There were no vitals filed for this visit. There is no height or weight on file to calculate BMI. No results found.   General: well developed, well nourished, seated, in no evident distress Head: head normocephalic and atraumatic.   Neck: supple with no carotid or supraclavicular bruits Cardiovascular: regular rate and rhythm, no murmurs  Neurologic Exam Mental Status: Awake and fully alert. Oriented to place and time. Recent and remote memory intact. Attention span, concentration and fund of knowledge appropriate. Mood and affect appropriate.  Cranial Nerves: Pupils equal, briskly reactive to light. Extraocular movements full without nystagmus. Visual fields full to confrontation. Hearing intact. Facial sensation intact. Face, tongue, palate moves normally and symmetrically.  Motor: Normal bulk and tone. Normal strength in all tested extremity muscles Gait and Station: Arises from chair without difficulty. Stance is normal. Gait demonstrates normal stride length and balance without use of AD.         ASSESSMENT/PLAN: Srihith Aquilino. is a 79 y.o. year old male    OSA on CPAP :  Compliance report shows satisfactory usage with optimal residual AHI.   Continue current  pressure setting of 10 with EPR 3 Discussed continued nightly usage with ensuring greater than 4 hours nightly for optimal benefit and per insurance purposes.   Continue to follow with DME company [employer] for any needed supplies or CPAP related concerns CPAP set up 08/2022, due for new machine 08/2027     Follow up in *** or call earlier if needed   CC:  PCP: Royden Ronal Czar, FNP    I personally spent a total of *** minutes in the care of the patient today including {Time Based Coding:[id number]}.     Harlene Bogaert, AGNP-BC  [facility] [address] [place], [place] [address]  Phone [phone] Fax [phone] Note: This document was prepared with digital dictation and possible smart phrase technology. Any transcriptional errors that result from this process are unintentional.         [1]  Current Outpatient Medications on File Prior to Visit  Medication Sig Dispense Refill   allopurinol  (ZYLOPRIM ) 300 MG tablet Take 300 mg by mouth daily.     amLODipine  (NORVASC ) 5 MG tablet Take 5 mg by mouth daily.     apixaban  (ELIQUIS ) 5 MG TABS tablet Take 1 tablet (5 mg total) by mouth 2 (two) times daily. 60 tablet 3   atorvastatin  (LIPITOR) 20 MG tablet Take 20 mg by mouth daily.     cholecalciferol  (VITAMIN D) 1000 UNITS tablet Take 2,000 Units by mouth daily. Vitamin d  3     folic acid  (FOLVITE ) 800 MCG tablet Take 800 mcg by mouth.     losartan (COZAAR) 100 MG tablet Take 100 mg by mouth every evening. Increase to 100 mg from 75 mg 06/12/22     nebivolol  (BYSTOLIC ) 10 MG tablet Take 5 mg by mouth daily.     triamterene -hydrochlorothiazide (MAXZIDE-25) 37.5-25 MG tablet Take 1 tablet by mouth daily. MUST MAKE APPT FOR FURTHER REFILLS [phone] 30 tablet 3   vitamin B-12 (CYANOCOBALAMIN) 1000 MCG tablet Take 1,000 mcg by mouth daily.     No current facility-administered medications on file prior to visit.  [2] No Known  Allergies  "

## 2024-10-03 ENCOUNTER — Encounter: Payer: Self-pay | Admitting: Adult Health

## 2024-10-03 ENCOUNTER — Ambulatory Visit: Payer: Medicare Other | Admitting: Adult Health

## 2024-10-03 VITALS — BP 134/71 | HR 62 | Ht 70.0 in | Wt 233.8 lb

## 2024-10-03 DIAGNOSIS — G4733 Obstructive sleep apnea (adult) (pediatric): Secondary | ICD-10-CM | POA: Diagnosis not present

## 2024-10-03 LAB — LAB REPORT - SCANNED
EGFR: 53
TSH: 1.96 (ref 0.41–5.90)

## 2024-10-03 NOTE — Patient Instructions (Addendum)
 Your Plan:  Continue nightly use of CPAP for adequate sleep apnea management  Continue to follow-up with DME company [employer] for any needed supplies or CPAP related concerns      Follow up in 1 year or call earlier if needed      Thank you for coming to see us  at [facility]. I hope we have been able to provide you high quality care today.  You may receive a patient satisfaction survey over the next few weeks. We would appreciate your feedback and comments so that we may continue to improve ourselves and the health of our patients.

## 2024-10-04 NOTE — Progress Notes (Signed)
 SABRA

## 2024-11-28 ENCOUNTER — Ambulatory Visit: Admitting: Cardiology

## 2025-10-03 ENCOUNTER — Ambulatory Visit: Admitting: Adult Health
